# Patient Record
Sex: Male | Born: 1935 | Race: White | Hispanic: No | Smoking: Former smoker
Health system: Southern US, Community
[De-identification: ages and names within clinical notes are randomized; demographics above are authoritative.]

## PROBLEM LIST (undated history)

## (undated) DIAGNOSIS — K219 Gastro-esophageal reflux disease without esophagitis: Secondary | ICD-10-CM

## (undated) DIAGNOSIS — E785 Hyperlipidemia, unspecified: Secondary | ICD-10-CM

## (undated) DIAGNOSIS — I10 Essential (primary) hypertension: Secondary | ICD-10-CM

## (undated) HISTORY — DX: Hyperlipidemia, unspecified: E78.5

## (undated) HISTORY — DX: Essential (primary) hypertension: I10

## (undated) HISTORY — DX: Gastro-esophageal reflux disease without esophagitis: K21.9

---

## 2007-12-30 HISTORY — PX: HERNIA REPAIR: SHX51

## 2010-09-26 ENCOUNTER — Ambulatory Visit: Payer: Self-pay | Admitting: Family Medicine

## 2011-01-28 NOTE — Assessment & Plan Note (Signed)
Summary: FLU SHOT/JBB  Nurse Visit   Immunizations Administered:  Influenza Vaccine # 1:    Vaccine Type: flulaval    Site: left deltoid    Mfr: GlaxoSmithKline    Dose: 0.5 ml    Route: IM    Given by: Tacey Ruiz MD    Exp. Date: 04/29/2011    Lot #: [id number]    VIS given: 07/23/10 version given September 26, 2010.  Flu Vaccine Consent Questions:    Do you have a history of severe allergic reactions to this vaccine? no    Any prior history of allergic reactions to egg and/or gelatin? no    Do you have a sensitivity to the preservative Thimersol? no    Do you have a past history of Guillan-Barre Syndrome? no    Do you currently have an acute febrile illness? no    Have you ever had a severe reaction to latex? no    Vaccine information given and explained to patient? yes  Orders Added: 1)  Flulaval Injection 3 yrs >IM [Q2036] 2)  Administration Flu vaccine - MCR [G0008]

## 2014-01-02 ENCOUNTER — Ambulatory Visit: Payer: Self-pay | Admitting: Family Medicine

## 2014-03-14 ENCOUNTER — Ambulatory Visit: Payer: Self-pay | Admitting: Family Medicine

## 2015-01-05 DIAGNOSIS — Z23 Encounter for immunization: Secondary | ICD-10-CM | POA: Diagnosis not present

## 2015-01-05 DIAGNOSIS — I10 Essential (primary) hypertension: Secondary | ICD-10-CM | POA: Diagnosis not present

## 2015-01-05 DIAGNOSIS — K219 Gastro-esophageal reflux disease without esophagitis: Secondary | ICD-10-CM | POA: Diagnosis not present

## 2015-01-05 DIAGNOSIS — E780 Pure hypercholesterolemia: Secondary | ICD-10-CM | POA: Diagnosis not present

## 2015-01-05 DIAGNOSIS — Z0000 Encounter for general adult medical examination without abnormal findings: Secondary | ICD-10-CM | POA: Diagnosis not present

## 2015-01-08 DIAGNOSIS — R7309 Other abnormal glucose: Secondary | ICD-10-CM | POA: Diagnosis not present

## 2015-01-08 DIAGNOSIS — E780 Pure hypercholesterolemia: Secondary | ICD-10-CM | POA: Diagnosis not present

## 2015-01-08 DIAGNOSIS — K219 Gastro-esophageal reflux disease without esophagitis: Secondary | ICD-10-CM | POA: Diagnosis not present

## 2015-01-08 DIAGNOSIS — I10 Essential (primary) hypertension: Secondary | ICD-10-CM | POA: Diagnosis not present

## 2015-01-16 DIAGNOSIS — Z1389 Encounter for screening for other disorder: Secondary | ICD-10-CM | POA: Diagnosis not present

## 2015-01-16 DIAGNOSIS — R7309 Other abnormal glucose: Secondary | ICD-10-CM | POA: Diagnosis not present

## 2015-01-16 DIAGNOSIS — E780 Pure hypercholesterolemia: Secondary | ICD-10-CM | POA: Diagnosis not present

## 2015-01-16 DIAGNOSIS — J029 Acute pharyngitis, unspecified: Secondary | ICD-10-CM | POA: Diagnosis not present

## 2015-01-16 DIAGNOSIS — J069 Acute upper respiratory infection, unspecified: Secondary | ICD-10-CM | POA: Diagnosis not present

## 2015-01-18 DIAGNOSIS — L570 Actinic keratosis: Secondary | ICD-10-CM | POA: Diagnosis not present

## 2015-01-18 DIAGNOSIS — L821 Other seborrheic keratosis: Secondary | ICD-10-CM | POA: Diagnosis not present

## 2015-02-05 DIAGNOSIS — H3532 Exudative age-related macular degeneration: Secondary | ICD-10-CM | POA: Diagnosis not present

## 2015-02-28 DIAGNOSIS — Z9181 History of falling: Secondary | ICD-10-CM | POA: Diagnosis not present

## 2015-02-28 DIAGNOSIS — J069 Acute upper respiratory infection, unspecified: Secondary | ICD-10-CM | POA: Diagnosis not present

## 2015-02-28 DIAGNOSIS — J029 Acute pharyngitis, unspecified: Secondary | ICD-10-CM | POA: Diagnosis not present

## 2015-03-08 DIAGNOSIS — J0110 Acute frontal sinusitis, unspecified: Secondary | ICD-10-CM | POA: Diagnosis not present

## 2015-03-28 DIAGNOSIS — H3532 Exudative age-related macular degeneration: Secondary | ICD-10-CM | POA: Diagnosis not present

## 2015-05-16 DIAGNOSIS — H3532 Exudative age-related macular degeneration: Secondary | ICD-10-CM | POA: Diagnosis not present

## 2015-07-11 DIAGNOSIS — H3532 Exudative age-related macular degeneration: Secondary | ICD-10-CM | POA: Diagnosis not present

## 2015-07-17 ENCOUNTER — Encounter: Payer: Self-pay | Admitting: Family Medicine

## 2015-07-17 ENCOUNTER — Ambulatory Visit (INDEPENDENT_AMBULATORY_CARE_PROVIDER_SITE_OTHER): Payer: Medicare Other | Admitting: Family Medicine

## 2015-07-17 VITALS — BP 105/50 | HR 57 | Temp 97.5°F | Resp 16 | Ht 67.0 in | Wt 145.8 lb

## 2015-07-17 DIAGNOSIS — I10 Essential (primary) hypertension: Secondary | ICD-10-CM | POA: Insufficient documentation

## 2015-07-17 DIAGNOSIS — E785 Hyperlipidemia, unspecified: Secondary | ICD-10-CM | POA: Diagnosis not present

## 2015-07-17 DIAGNOSIS — K219 Gastro-esophageal reflux disease without esophagitis: Secondary | ICD-10-CM | POA: Insufficient documentation

## 2015-07-17 DIAGNOSIS — K2100 Gastro-esophageal reflux disease with esophagitis, without bleeding: Secondary | ICD-10-CM

## 2015-07-17 DIAGNOSIS — R7309 Other abnormal glucose: Secondary | ICD-10-CM

## 2015-07-17 DIAGNOSIS — R739 Hyperglycemia, unspecified: Secondary | ICD-10-CM

## 2015-07-17 LAB — POCT GLYCOSYLATED HEMOGLOBIN (HGB A1C): Hemoglobin A1C: 6.4

## 2015-07-17 MED ORDER — LISINOPRIL 10 MG PO TABS: 10.0000 mg | ORAL_TABLET | Freq: Every day | ORAL | Status: DC

## 2015-07-17 MED ORDER — PRAVASTATIN SODIUM 20 MG PO TABS: ORAL_TABLET | ORAL | Status: DC

## 2015-07-17 NOTE — Patient Instructions (Addendum)
Take 1/2 of 30 mg. Lisinopril until bottle empty, then change to 10 mg., 1 each day. Take 1/2 of 40 mg Pravastatin every other day until bottle empty, then change to 20 mg, every other night. Decrease starches and sugars in diet. Plan A1c on return.

## 2015-07-17 NOTE — Progress Notes (Signed)
Name: Travis Mccoy   MRN: [id number]    DOB: 1922/07/29   Date:07/17/2015       Progress Note  Subjective  Chief Complaint  Chief Complaint  Patient presents with  . Hypertension  . Arm Injury    3 WKS ago....skin damage at injury site left arm     HPI  Here for f/u of HBP.  Taking meds and feeling well.  Also with elevated lipids and GERD.  Doing well with these also.  Had been founmd to have FBS of 133 in past.  Not known to be diabetic.   Past Medical History  Diagnosis Date  . Hypertension   . Hyperlipidemia   . GERD (gastroesophageal reflux disease)     Past Surgical History  Procedure Laterality Date  . Hernia repair  2009    Family History  Problem Relation Age of Onset  . Stroke Father     History   Social History  . Marital Status: Married    Spouse Name: N/A  . Number of Children: N/A  . Years of Education: N/A   Occupational History  . Not on file.   Social History Main Topics  . Smoking status: Former Smoker    Types: Cigarettes    Quit date: 08/30/1959  . Smokeless tobacco: Never Used  . Alcohol Use: No  . Drug Use: No  . Sexual Activity: Not on file   Other Topics Concern  . Not on file   Social History Narrative  . No narrative on file     Current outpatient prescriptions:  .  aspirin 81 MG tablet, Take 81 mg by mouth daily., Disp: , Rfl:  .  fluticasone (FLONASE) 50 MCG/ACT nasal spray, , Disp: , Rfl:  .  lisinopril (PRINIVIL,ZESTRIL) 30 MG tablet, , Disp: , Rfl:  .  omeprazole (PRILOSEC) 20 MG capsule, , Disp: , Rfl:  .  pravastatin (PRAVACHOL) 40 MG tablet, , Disp: , Rfl:   No Known Allergies   Review of Systems  Constitutional: Negative.  Negative for fever, chills, weight loss and malaise/fatigue.  HENT: Negative.  Negative for congestion.   Eyes: Negative.  Negative for blurred vision and double vision.  Respiratory: Negative.  Negative for cough, sputum production, shortness of breath and wheezing.   Cardiovascular:  Negative.  Negative for chest pain, palpitations, orthopnea, claudication and leg swelling.  Gastrointestinal: Negative.  Negative for heartburn, nausea, vomiting, abdominal pain, diarrhea and blood in stool.  Genitourinary: Negative.  Negative for dysuria, urgency and frequency.  Musculoskeletal: Negative.  Negative for myalgias and joint pain.  Skin: Negative for rash.  Neurological: Negative for dizziness, tremors, sensory change, focal weakness, weakness and headaches.  Psychiatric/Behavioral: Negative for depression. The patient is not nervous/anxious.      Objective  Filed Vitals:   07/17/15 0856  BP: 109/58  Pulse: 57  Temp: 97.5 F (36.4 C)  Resp: 16  Height: 5\' 7"  (1.702 m)  Weight: 145 lb 12.8 oz (66.134 kg)    Physical Exam  Constitutional: He is oriented to person, place, and time and well-developed, well-nourished, and in no distress.  HENT:  Head: Normocephalic and atraumatic.  Eyes: Conjunctivae and EOM are normal. Pupils are equal, round, and reactive to light.  Neck: Normal range of motion. Neck supple. No thyromegaly present.  Cardiovascular: Normal rate, regular rhythm, normal heart sounds and intact distal pulses.  Exam reveals no gallop and no friction rub.   No murmur heard. Pulmonary/Chest: Effort normal and breath  sounds normal. No respiratory distress. He has no wheezes. He has no rales.  Abdominal: Soft. Bowel sounds are normal. He exhibits no distension and no mass. There is no tenderness.  Musculoskeletal: He exhibits no edema.  Lymphadenopathy:    He has no cervical adenopathy.  Neurological: He is alert and oriented to person, place, and time. He exhibits normal muscle tone. Coordination normal.  Vitals reviewed.        Assessment & Plan  Problem List Items Addressed This Visit      Cardiovascular and Mediastinum   Hypertension - Primary   Relevant Medications   pravastatin (PRAVACHOL) 40 MG tablet   lisinopril (PRINIVIL,ZESTRIL) 30  MG tablet   aspirin 81 MG tablet     Digestive   GERD (gastroesophageal reflux disease)   Relevant Medications   omeprazole (PRILOSEC) 20 MG capsule     Other   Hyperlipidemia   Relevant Medications   pravastatin (PRAVACHOL) 40 MG tablet   lisinopril (PRINIVIL,ZESTRIL) 30 MG tablet   aspirin 81 MG tablet      Meds ordered this encounter  Medications  . pravastatin (PRAVACHOL) 40 MG tablet    Sig:   . omeprazole (PRILOSEC) 20 MG capsule    Sig:   . lisinopril (PRINIVIL,ZESTRIL) 30 MG tablet    Sig:   . fluticasone (FLONASE) 50 MCG/ACT nasal spray    Sig:   . aspirin 81 MG tablet    Sig: Take 81 mg by mouth daily.   1. Essential hypertension  - lisinopril (PRINIVIL,ZESTRIL) 10 MG tablet; Take 1 tablet (10 mg total) by mouth daily.  Dispense: 90 tablet; Refill: 3-decreased  2. Hyperlipidemia  - pravastatin (PRAVACHOL) 20 MG tablet; Take 1 tablet by mouth every other evening  Dispense: 45 tablet; Refill: 3-decreased  3. Gastroesophageal reflux disease with esophagitis --continue Omeprazole  4. Elevated blood sugar  - POCT HgB A1C-6.4

## 2015-09-21 DIAGNOSIS — H3532 Exudative age-related macular degeneration: Secondary | ICD-10-CM | POA: Diagnosis not present

## 2015-10-03 ENCOUNTER — Encounter: Payer: Self-pay | Admitting: Family Medicine

## 2015-10-03 ENCOUNTER — Ambulatory Visit (INDEPENDENT_AMBULATORY_CARE_PROVIDER_SITE_OTHER): Payer: Medicare Other | Admitting: Family Medicine

## 2015-10-03 VITALS — BP 129/79 | HR 78 | Temp 97.9°F | Resp 16 | Ht 67.0 in | Wt 148.2 lb

## 2015-10-03 DIAGNOSIS — E785 Hyperlipidemia, unspecified: Secondary | ICD-10-CM | POA: Diagnosis not present

## 2015-10-03 DIAGNOSIS — J302 Other seasonal allergic rhinitis: Secondary | ICD-10-CM

## 2015-10-03 DIAGNOSIS — Z0189 Encounter for other specified special examinations: Secondary | ICD-10-CM

## 2015-10-03 DIAGNOSIS — I10 Essential (primary) hypertension: Secondary | ICD-10-CM

## 2015-10-03 DIAGNOSIS — Z23 Encounter for immunization: Secondary | ICD-10-CM | POA: Diagnosis not present

## 2015-10-03 DIAGNOSIS — K2100 Gastro-esophageal reflux disease with esophagitis, without bleeding: Secondary | ICD-10-CM

## 2015-10-03 DIAGNOSIS — Z0000 Encounter for general adult medical examination without abnormal findings: Secondary | ICD-10-CM

## 2015-10-03 MED ORDER — FLUTICASONE PROPIONATE 50 MCG/ACT NA SUSP: 2.0000 | Freq: Every day | NASAL | Status: DC

## 2015-10-03 MED ORDER — OMEPRAZOLE 20 MG PO CPDR: 20.0000 mg | DELAYED_RELEASE_CAPSULE | Freq: Two times a day (BID) | ORAL | Status: DC

## 2015-10-03 MED ORDER — LISINOPRIL 10 MG PO TABS: 10.0000 mg | ORAL_TABLET | Freq: Every day | ORAL | Status: DC

## 2015-10-03 MED ORDER — PRAVASTATIN SODIUM 20 MG PO TABS: ORAL_TABLET | ORAL | Status: DC

## 2015-10-03 NOTE — Progress Notes (Signed)
Name: Travis Mccoy   MRN: [id number]    DOB: 1922/10/14   Date:10/03/2015       Progress Note  Subjective  Chief Complaint  Chief Complaint  Patient presents with  . Annual Exam    HPI  Here for annual wellness examination.  Has HBP, Hyperlipidemia, GERD, Seasonal allergies.  C/o some insomnia problems on and off.  GERD not totally controlled on 1 Omeprazole in evening.  Otherwise no c/o No problem-specific assessment & plan notes found for this encounter.   Past Medical History  Diagnosis Date  . Hypertension   . Hyperlipidemia   . GERD (gastroesophageal reflux disease)     Past Surgical History  Procedure Laterality Date  . Hernia repair  2009    Family History  Problem Relation Age of Onset  . Stroke Father     Social History   Social History  . Marital Status: Married    Spouse Name: N/A  . Number of Children: N/A  . Years of Education: N/A   Occupational History  . Not on file.   Social History Main Topics  . Smoking status: Former Smoker    Types: Cigarettes    Quit date: 08/30/1959  . Smokeless tobacco: Never Used  . Alcohol Use: Yes     Comment: beers  . Drug Use: No  . Sexual Activity: Not on file   Other Topics Concern  . Not on file   Social History Narrative     Current outpatient prescriptions:  .  aspirin 81 MG tablet, Take 81 mg by mouth daily., Disp: , Rfl:  .  fluticasone (FLONASE) 50 MCG/ACT nasal spray, Place 2 sprays into both nostrils daily., Disp: 16 g, Rfl: 12 .  lisinopril (PRINIVIL,ZESTRIL) 10 MG tablet, Take 1 tablet (10 mg total) by mouth daily., Disp: 90 tablet, Rfl: 3 .  omeprazole (PRILOSEC) 20 MG capsule, Take 1 capsule (20 mg total) by mouth 2 (two) times daily before a meal., Disp: 60 capsule, Rfl: 12 .  pravastatin (PRAVACHOL) 20 MG tablet, Take 1 tablet by mouth every other evening, Disp: 45 tablet, Rfl: 3  No Known Allergies   Review of Systems  Constitutional: Negative for fever, chills, weight loss and  malaise/fatigue.  HENT: Negative for congestion and hearing loss.   Eyes: Negative for blurred vision and double vision.  Respiratory: Negative for cough, shortness of breath and wheezing.   Cardiovascular: Negative for chest pain, palpitations, orthopnea and leg swelling.  Gastrointestinal: Positive for heartburn. Negative for nausea, vomiting, abdominal pain, diarrhea and blood in stool.  Genitourinary: Negative for dysuria, urgency and frequency.  Musculoskeletal: Negative for myalgias and joint pain.  Skin: Negative for rash.  Neurological: Negative for dizziness, tremors, sensory change, focal weakness, weakness and headaches.  Psychiatric/Behavioral: The patient has insomnia (occ.).       Objective  Filed Vitals:   10/03/15 0911  BP: 129/79  Pulse: 78  Temp: 97.9 F (36.6 C)  TempSrc: Oral  Resp: 16  Height: 5\' 7"  (1.702 m)  Weight: 148 lb 3.2 oz (67.223 kg)    Physical Exam  Constitutional: He is oriented to person, place, and time and well-developed, well-nourished, and in no distress. No distress.  HENT:  Head: Normocephalic and atraumatic.  Right Ear: External ear normal.  Left Ear: External ear normal.  Nose: Nose normal.  Mouth/Throat: Oropharynx is clear and moist.  Eyes: Conjunctivae and EOM are normal. Pupils are equal, round, and reactive to light. No scleral icterus.  Fundoscopic exam:      The right eye shows no arteriolar narrowing, no AV nicking, no exudate and no hemorrhage.    Neck: Normal range of motion. Neck supple. Carotid bruit is not present. No thyromegaly present.  Cardiovascular: Normal rate, regular rhythm, normal heart sounds and intact distal pulses.  Exam reveals no gallop and no friction rub.   No murmur heard. Pulmonary/Chest: Effort normal and breath sounds normal. No respiratory distress. He has no wheezes. He has no rales.  Abdominal: Soft. Bowel sounds are normal. He exhibits no distension and no abdominal bruit. There is no  tenderness. There is no rebound.  Genitourinary: Penis normal. He exhibits no abnormal testicular mass, no testicular tenderness, no abnormal scrotal mass, no scrotal tenderness and no epididymal tenderness. No discharge found.  Musculoskeletal: Normal range of motion. He exhibits no edema or tenderness.  Lymphadenopathy:    He has no cervical adenopathy.  Neurological: He is alert and oriented to person, place, and time. No cranial nerve deficit. Gait normal.  Skin: Skin is warm and dry.  Vitals reviewed.      Recent Results (from the past 2160 hour(s))  POCT HgB A1C     Status: Abnormal   Collection Time: 07/17/15  9:27 AM  Result Value Ref Range   Hemoglobin A1C 6.4      Assessment & Plan  Problem List Items Addressed This Visit      Cardiovascular and Mediastinum   Hypertension   Relevant Medications   lisinopril (PRINIVIL,ZESTRIL) 10 MG tablet   pravastatin (PRAVACHOL) 20 MG tablet   Other Relevant Orders   Comprehensive Metabolic Panel (CMET)     Respiratory   Seasonal allergies   Relevant Medications   fluticasone (FLONASE) 50 MCG/ACT nasal spray     Digestive   GERD (gastroesophageal reflux disease)   Relevant Medications   omeprazole (PRILOSEC) 20 MG capsule   Other Relevant Orders   CBC with Differential     Other   Hyperlipidemia   Relevant Medications   lisinopril (PRINIVIL,ZESTRIL) 10 MG tablet   pravastatin (PRAVACHOL) 20 MG tablet   Other Relevant Orders   Lipid Profile   Wellness examination - Primary   Need for influenza vaccination   Relevant Orders   Flu vaccine HIGH DOSE PF (Fluzone High dose) (Completed)      Meds ordered this encounter  Medications  . omeprazole (PRILOSEC) 20 MG capsule    Sig: Take 1 capsule (20 mg total) by mouth 2 (two) times daily before a meal.    Dispense:  60 capsule    Refill:  12  . fluticasone (FLONASE) 50 MCG/ACT nasal spray    Sig: Place 2 sprays into both nostrils daily.    Dispense:  16 g     Refill:  12  . lisinopril (PRINIVIL,ZESTRIL) 10 MG tablet    Sig: Take 1 tablet (10 mg total) by mouth daily.    Dispense:  90 tablet    Refill:  3  . pravastatin (PRAVACHOL) 20 MG tablet    Sig: Take 1 tablet by mouth every other evening    Dispense:  45 tablet    Refill:  3    1. Wellness examination   2. Need for influenza vaccination  - Flu vaccine HIGH DOSE PF (Fluzone High dose)  3. Essential hypertension  - lisinopril (PRINIVIL,ZESTRIL) 10 MG tablet; Take 1 tablet (10 mg total) by mouth daily.  Dispense: 90 tablet; Refill: 3 - Comprehensive Metabolic Panel (CMET)  4.  Hyperlipidemia  - pravastatin (PRAVACHOL) 20 MG tablet; Take 1 tablet by mouth every other evening  Dispense: 45 tablet; Refill: 3 - Lipid Profile  5. Gastroesophageal reflux disease with esophagitis  - omeprazole (PRILOSEC) 20 MG capsule; Take 1 capsule (20 mg total) by mouth 2 (two) times daily before a meal.  Dispense: 60 capsule; Refill: 12 - CBC with Differential  6. Seasonal allergies  - fluticasone (FLONASE) 50 MCG/ACT nasal spray; Place 2 sprays into both nostrils daily.  Dispense: 16 g; Refill: 12

## 2015-10-03 NOTE — Patient Instructions (Signed)
Continue current meds 

## 2015-10-09 DIAGNOSIS — E785 Hyperlipidemia, unspecified: Secondary | ICD-10-CM | POA: Diagnosis not present

## 2015-10-09 DIAGNOSIS — R7309 Other abnormal glucose: Secondary | ICD-10-CM | POA: Diagnosis not present

## 2015-10-09 DIAGNOSIS — K210 Gastro-esophageal reflux disease with esophagitis: Secondary | ICD-10-CM | POA: Diagnosis not present

## 2015-10-09 DIAGNOSIS — I10 Essential (primary) hypertension: Secondary | ICD-10-CM | POA: Diagnosis not present

## 2015-10-09 LAB — CBC WITH DIFFERENTIAL/PLATELET
BASOS: 1 %
Basophils Absolute: 0.1 10*3/uL (ref 0.0–0.2)
EOS (ABSOLUTE): 0.2 10*3/uL (ref 0.0–0.4)
Eos: 3 %
Hematocrit: 42.3 % (ref 37.5–51.0)
Hemoglobin: 14.6 g/dL (ref 12.6–17.7)
IMMATURE GRANS (ABS): 0 10*3/uL (ref 0.0–0.1)
Immature Granulocytes: 0 %
LYMPHS ABS: 2.2 10*3/uL (ref 0.7–3.1)
LYMPHS: 30 %
MCH: 31.9 pg (ref 26.6–33.0)
MCHC: 34.5 g/dL (ref 31.5–35.7)
MCV: 92 fL (ref 79–97)
MONOS ABS: 0.7 10*3/uL (ref 0.1–0.9)
Monocytes: 9 %
NEUTROS ABS: 4.2 10*3/uL (ref 1.4–7.0)
Neutrophils: 57 %
PLATELETS: 198 10*3/uL (ref 150–379)
RBC: 4.58 x10E6/uL (ref 4.14–5.80)
RDW: 13.3 % (ref 12.3–15.4)
WBC: 7.3 10*3/uL (ref 3.4–10.8)

## 2015-10-10 LAB — LIPID PANEL
Chol/HDL Ratio: 3.6 ratio units (ref 0.0–5.0)
Cholesterol, Total: 176 mg/dL (ref 100–199)
HDL: 49 mg/dL (ref >39)
LDL Calculated: 99 mg/dL (ref 0–99)
TRIGLYCERIDES: 141 mg/dL (ref 0–149)
VLDL CHOLESTEROL CAL: 28 mg/dL (ref 5–40)

## 2015-10-10 LAB — COMPREHENSIVE METABOLIC PANEL
ALT: 19 IU/L (ref 0–44)
AST: 25 IU/L (ref 0–40)
Albumin/Globulin Ratio: 2.2 (ref 1.1–2.5)
Albumin: 4.3 g/dL (ref 3.2–4.6)
Alkaline Phosphatase: 103 IU/L (ref 39–117)
BUN/Creatinine Ratio: 17 (ref 10–22)
BUN: 15 mg/dL (ref 10–36)
Bilirubin Total: 0.4 mg/dL (ref 0.0–1.2)
CALCIUM: 9.5 mg/dL (ref 8.6–10.2)
CHLORIDE: 100 mmol/L (ref 97–108)
CO2: 27 mmol/L (ref 18–29)
Creatinine, Ser: 0.88 mg/dL (ref 0.76–1.27)
GFR, EST AFRICAN AMERICAN: 87 mL/min/{1.73_m2} (ref >59)
GFR, EST NON AFRICAN AMERICAN: 75 mL/min/{1.73_m2} (ref >59)
GLUCOSE: 126 mg/dL — AB (ref 65–99)
Globulin, Total: 2 g/dL (ref 1.5–4.5)
Potassium: 4.7 mmol/L (ref 3.5–5.2)
Sodium: 140 mmol/L (ref 134–144)
TOTAL PROTEIN: 6.3 g/dL (ref 6.0–8.5)

## 2015-10-12 LAB — HGB A1C W/O EAG: HEMOGLOBIN A1C: 6.2 % — AB (ref 4.8–5.6)

## 2015-10-12 LAB — SPECIMEN STATUS REPORT

## 2015-10-24 ENCOUNTER — Ambulatory Visit: Payer: Medicare Other | Admitting: Family Medicine

## 2015-12-21 DIAGNOSIS — H353211 Exudative age-related macular degeneration, right eye, with active choroidal neovascularization: Secondary | ICD-10-CM | POA: Diagnosis not present

## 2016-01-29 ENCOUNTER — Encounter: Payer: Self-pay | Admitting: Family Medicine

## 2016-01-29 ENCOUNTER — Ambulatory Visit (INDEPENDENT_AMBULATORY_CARE_PROVIDER_SITE_OTHER): Payer: Medicare Other | Admitting: Family Medicine

## 2016-01-29 VITALS — BP 157/75 | HR 55 | Temp 97.4°F | Resp 16 | Ht 67.0 in | Wt 148.2 lb

## 2016-01-29 DIAGNOSIS — Z0000 Encounter for general adult medical examination without abnormal findings: Secondary | ICD-10-CM | POA: Diagnosis not present

## 2016-01-29 NOTE — Patient Instructions (Signed)
Health Maintenance  Topic Date Due  . ZOSTAVAX  12/29/2016 (Originally 09/22/1984)  . INFLUENZA VACCINE  07/29/2016  . TETANUS/TDAP  01/05/2025  . PNA vac Low Risk Adult  Completed

## 2016-01-29 NOTE — Progress Notes (Signed)
Patient: Travis Mccoy, Male    DOB: 1922/12/01, 82 y.o.   MRN: :[id number] Visit Date: 01/29/2016  Today's Provider: Dicky Doe, MD   Chief Complaint  Patient presents with  . Medicare Wellness    Subs.    Subjective:    Annual wellness visit Travis Mccoy is a 82 y.o. male who presents today for his Subsequent Annual Wellness Visit. He feels well. He reports exercising . He reports he is sleeping well.   ----------------------------------------------------------- HPI  Review of Systems  Social History   Social History  . Marital Status: Married    Spouse Name: N/A  . Number of Children: N/A  . Years of Education: N/A   Occupational History  . Not on file.   Social History Main Topics  . Smoking status: Former Smoker    Types: Cigarettes    Quit date: 08/30/1959  . Smokeless tobacco: Never Used  . Alcohol Use: Yes     Comment: beers  . Drug Use: No  . Sexual Activity: Not on file   Other Topics Concern  . Not on file   Social History Narrative    Patient Active Problem List   Diagnosis Date Noted  . Wellness examination 10/03/2015  . Seasonal allergies 10/03/2015  . Need for influenza vaccination 10/03/2015  . Hypertension 07/17/2015  . Hyperlipidemia 07/17/2015  . GERD (gastroesophageal reflux disease) 07/17/2015  . Elevated blood sugar 07/17/2015    Past Surgical History  Procedure Laterality Date  . Hernia repair  2009    His family history includes Stroke in his father.    Previous Medications   ASPIRIN 81 MG TABLET    Take 81 mg by mouth daily.   FLUTICASONE (FLONASE) 50 MCG/ACT NASAL SPRAY    Place 2 sprays into both nostrils daily.   LISINOPRIL (PRINIVIL,ZESTRIL) 10 MG TABLET    Take 1 tablet (10 mg total) by mouth daily.   OMEPRAZOLE (PRILOSEC) 20 MG CAPSULE    Take 1 capsule (20 mg total) by mouth 2 (two) times daily before a meal.   PRAVASTATIN (PRAVACHOL) 20 MG TABLET    Take 1 tablet by mouth every other evening    Patient Care  Team: Arlis Porta., MD as PCP - General (Family Medicine)     Objective:   Vitals: BP 157/75 mmHg  Pulse 55  Temp(Src) 97.4 F (36.3 C)  Resp 16  Ht 5\' 7"  (1.702 m)  Wt 148 lb 3.2 oz (67.223 kg)  BMI 23.21 kg/m2  Physical Exam  Activities of Daily Living In your present state of health, do you have any difficulty performing the following activities: 01/29/2016 07/17/2015  Hearing? Tempie Donning  Vision? N Y  Difficulty concentrating or making decisions? Tempie Donning  Walking or climbing stairs? Y Y  Dressing or bathing? N N  Doing errands, shopping? N N  Preparing Food and eating ? N -  Using the Toilet? N -  In the past six months, have you accidently leaked urine? N -  Do you have problems with loss of bowel control? N -  Managing your Medications? N -  Managing your Finances? N -  Housekeeping or managing your Housekeeping? N -    Fall Risk Assessment Fall Risk  01/29/2016 07/17/2015  Falls in the past year? Yes No  Number falls in past yr: 1 -  Injury with Fall? No -  Risk for fall due to : Other (Comment) -     Patient reports  there are safety devices in place in shower at home.   Depression Screen PHQ 2/9 Scores 01/29/2016 07/17/2015  PHQ - 2 Score 0 0     MMSE MMSE - Mini Mental State Exam 01/29/2016  Orientation to time 5  Orientation to Place 5  Registration 3  Attention/ Calculation 5  Recall 0  Language- name 2 objects 2  Language- repeat 1  Language- follow 3 step command 3  Language- read & follow direction 1  Write a sentence 1  Copy design 1  Total score 27     Assessment & Plan:     Annual Wellness Visit  Reviewed patient's Family Medical History Reviewed and updated list of patient's medical providers Assessment of cognitive impairment was done Assessed patient's functional ability Established a written schedule for health screening [place] Completed and Reviewed  Exercise Activities and Dietary recommendations Goals     . cleaning     Would like to clean rifles and put away for winter.        Immunization History  Administered Date(s) Administered  . Influenza Whole 09/26/2010  . Influenza, High Dose Seasonal PF 10/03/2015  . Influenza-Unspecified 10/10/2014  . Pneumococcal Conjugate-13 09/27/2014  . Pneumococcal Polysaccharide-23 12/30/2007  . Tdap 01/05/2015    Health Maintenance  Topic Date Due  . ZOSTAVAX  12/29/2016 (Originally 09/22/1984)  . INFLUENZA VACCINE  07/29/2016  . TETANUS/TDAP  01/05/2025  . PNA vac Low Risk Adult  Completed     Discussed health benefits of physical activity, and encouraged him to engage in regular exercise appropriate for his age and condition.    ------------------------------------------------------------------------------------------------------------   Problem List Items Addressed This Visit    None       Larene Beach, MD [facility]  01/29/2016

## 2016-02-29 DIAGNOSIS — H353211 Exudative age-related macular degeneration, right eye, with active choroidal neovascularization: Secondary | ICD-10-CM | POA: Diagnosis not present

## 2016-04-03 ENCOUNTER — Ambulatory Visit (INDEPENDENT_AMBULATORY_CARE_PROVIDER_SITE_OTHER): Payer: Medicare Other | Admitting: Family Medicine

## 2016-04-03 ENCOUNTER — Encounter: Payer: Self-pay | Admitting: Family Medicine

## 2016-04-03 VITALS — BP 160/80 | HR 70 | Temp 98.1°F | Resp 16 | Ht 67.0 in | Wt 152.0 lb

## 2016-04-03 DIAGNOSIS — R7309 Other abnormal glucose: Secondary | ICD-10-CM

## 2016-04-03 DIAGNOSIS — R739 Hyperglycemia, unspecified: Secondary | ICD-10-CM

## 2016-04-03 DIAGNOSIS — I10 Essential (primary) hypertension: Secondary | ICD-10-CM | POA: Diagnosis not present

## 2016-04-03 MED ORDER — LISINOPRIL 20 MG PO TABS: 20.0000 mg | ORAL_TABLET | Freq: Every day | ORAL | Status: DC

## 2016-04-03 NOTE — Progress Notes (Signed)
Name: Travis Mccoy   MRN: [id number]    DOB: 11-25-1922   Date:04/03/2016       Progress Note  Subjective  Chief Complaint  Chief Complaint  Patient presents with  . Hypertension    HPI Here for f/u of HB P.  Taking meds and feeling well.  He is prediabetic with last A1c of 6.2.  No problem-specific assessment & plan notes found for this encounter.   Past Medical History  Diagnosis Date  . Hypertension   . Hyperlipidemia   . GERD (gastroesophageal reflux disease)     Past Surgical History  Procedure Laterality Date  . Hernia repair  2009    Family History  Problem Relation Age of Onset  . Stroke Father     Social History   Social History  . Marital Status: Married    Spouse Name: N/A  . Number of Children: N/A  . Years of Education: N/A   Occupational History  . Not on file.   Social History Main Topics  . Smoking status: Former Smoker    Types: Cigarettes    Quit date: 08/30/1959  . Smokeless tobacco: Never Used  . Alcohol Use: Yes     Comment: beers  . Drug Use: No  . Sexual Activity: Not on file   Other Topics Concern  . Not on file   Social History Narrative     Current outpatient prescriptions:  .  aspirin 81 MG tablet, Take 81 mg by mouth daily., Disp: , Rfl:  .  fluticasone (FLONASE) 50 MCG/ACT nasal spray, Place 2 sprays into both nostrils daily., Disp: 16 g, Rfl: 12 .  lisinopril (PRINIVIL,ZESTRIL) 20 MG tablet, Take 1 tablet (20 mg total) by mouth daily., Disp: 30 tablet, Rfl: 6 .  omeprazole (PRILOSEC) 20 MG capsule, Take 1 capsule (20 mg total) by mouth 2 (two) times daily before a meal., Disp: 60 capsule, Rfl: 12 .  pravastatin (PRAVACHOL) 20 MG tablet, Take 1 tablet by mouth every other evening, Disp: 45 tablet, Rfl: 3  No Known Allergies   Review of Systems  Constitutional: Positive for malaise/fatigue. Negative for fever, chills and weight loss.  HENT: Negative for hearing loss.   Eyes: Negative for blurred vision and double  vision.  Respiratory: Negative for cough, shortness of breath and wheezing.   Cardiovascular: Negative for chest pain, palpitations and leg swelling.  Gastrointestinal: Negative for heartburn, abdominal pain and blood in stool.  Genitourinary: Negative for dysuria, urgency and frequency.  Musculoskeletal: Negative for myalgias.  Skin: Negative for rash.  Neurological: Negative for dizziness, tremors, weakness and headaches.      Objective  Filed Vitals:   04/03/16 0932 04/03/16 0956  BP: 169/79 160/80  Pulse: 70   Temp: 98.1 F (36.7 C)   TempSrc: Oral   Resp: 16   Height: 5\' 7"  (1.702 m)   Weight: 152 lb (68.947 kg)   SpO2: 100%     Physical Exam  Constitutional: He is oriented to person, place, and time and well-developed, well-nourished, and in no distress. No distress.  HENT:  Head: Normocephalic and atraumatic.  Eyes: Conjunctivae and EOM are normal. Pupils are equal, round, and reactive to light. No scleral icterus.  Neck: Normal range of motion. Neck supple. Carotid bruit is not present. No thyromegaly present.  Cardiovascular: Normal rate, regular rhythm and normal heart sounds.  Exam reveals no gallop and no friction rub.   No murmur heard. Pulmonary/Chest: Effort normal and breath sounds normal. No  respiratory distress. He has no wheezes. He has no rales.  Musculoskeletal: He exhibits no edema.  Lymphadenopathy:    He has no cervical adenopathy.  Neurological: He is alert and oriented to person, place, and time.  Vitals reviewed.      No results found for this or any previous visit (from the past 2160 hour(s)).   Assessment & Plan  Problem List Items Addressed This Visit      Cardiovascular and Mediastinum   Hypertension - Primary   Relevant Medications   lisinopril (PRINIVIL,ZESTRIL) 20 MG tablet   Other Relevant Orders   BASIC METABOLIC PANEL WITH GFR     Other   Elevated blood sugar   Relevant Orders   HgB A1c      Meds ordered this  encounter  Medications  . lisinopril (PRINIVIL,ZESTRIL) 20 MG tablet    Sig: Take 1 tablet (20 mg total) by mouth daily.    Dispense:  30 tablet    Refill:  6   1. Essential hypertension  - BASIC METABOLIC PANEL WITH GFR - lisinopril (PRINIVIL,ZESTRIL) 20 MG tablet; Take 1 tablet (20 mg total) by mouth daily.  Dispense: 30 tablet; Refill: 6 (increased from 10 mg/d.)  2. Elevated blood sugar  - HgB A1c

## 2016-04-07 DIAGNOSIS — H353211 Exudative age-related macular degeneration, right eye, with active choroidal neovascularization: Secondary | ICD-10-CM | POA: Diagnosis not present

## 2016-04-10 ENCOUNTER — Ambulatory Visit (INDEPENDENT_AMBULATORY_CARE_PROVIDER_SITE_OTHER): Payer: Medicare Other | Admitting: Family Medicine

## 2016-04-10 VITALS — BP 115/65 | HR 76 | Temp 97.7°F | Resp 16 | Ht 67.0 in | Wt 152.8 lb

## 2016-04-10 DIAGNOSIS — R05 Cough: Secondary | ICD-10-CM | POA: Diagnosis not present

## 2016-04-10 DIAGNOSIS — J0100 Acute maxillary sinusitis, unspecified: Secondary | ICD-10-CM

## 2016-04-10 DIAGNOSIS — R059 Cough, unspecified: Secondary | ICD-10-CM

## 2016-04-10 MED ORDER — DM-GUAIFENESIN ER 30-600 MG PO TB12: 1.0000 | ORAL_TABLET | Freq: Two times a day (BID) | ORAL | Status: DC

## 2016-04-10 MED ORDER — AMOXICILLIN-POT CLAVULANATE 875-125 MG PO TABS: 1.0000 | ORAL_TABLET | Freq: Two times a day (BID) | ORAL | Status: DC

## 2016-04-10 MED ORDER — BENZONATATE 100 MG PO CAPS: 100.0000 mg | ORAL_CAPSULE | Freq: Every evening | ORAL | Status: DC | PRN

## 2016-04-10 NOTE — Patient Instructions (Signed)
You can use supportive care at home to help with your symptoms. I have sent Mucinex DM to your pharmacy to help break up the congestion and soothe your cough. You can takes this twice daily.  I have also sent tesslon perles to your pharmacy to help with the cough- you can take these 3 times daily as needed. Honey is a natural cough suppressant- so add it to your tea in the morning.  If you have a humidifer, set that up in your bedroom at night.   Take Augment twice daily for 5 days.   Please seek immediate medical attention if you develop shortness of breath , chest pain/tightness, fever > 103 F or other concerning symptoms.

## 2016-04-10 NOTE — Progress Notes (Signed)
Subjective:    Patient ID: Travis Mccoy, male    DOB: 1922-11-03, 82 y.o.   MRN: [id number]  HPI: Travis Mccoy is a 82 y.o. male presenting on 04/10/2016 for Influenza   HPI  Was at hospital to get a shot in his eye for macular degeneration and started having symptoms shortly after leaving hospital. He is worried that he picked up something there. Symptoms include rhinorrhea- green drainage, sneezing, coughing. Not a productive cough. No CP/SOB, no fever. Symptoms started last Tuesday. He started taking mucinex yesterday morning, which has helped his symptoms. No change to appetite. Coughing is waking him up at night.  Past Medical History  Diagnosis Date  . Hypertension   . Hyperlipidemia   . GERD (gastroesophageal reflux disease)     Current Outpatient Prescriptions on File Prior to Visit  Medication Sig  . aspirin 81 MG tablet Take 81 mg by mouth daily.  . fluticasone (FLONASE) 50 MCG/ACT nasal spray Place 2 sprays into both nostrils daily.  Marland Kitchen lisinopril (PRINIVIL,ZESTRIL) 20 MG tablet Take 1 tablet (20 mg total) by mouth daily.  Marland Kitchen omeprazole (PRILOSEC) 20 MG capsule Take 1 capsule (20 mg total) by mouth 2 (two) times daily before a meal.  . pravastatin (PRAVACHOL) 20 MG tablet Take 1 tablet by mouth every other evening   No current facility-administered medications on file prior to visit.    Review of Systems  Constitutional: Negative for fever and appetite change.  HENT: Positive for congestion, hearing loss (chronic), rhinorrhea and sneezing. Negative for ear pain.   Eyes: Negative for visual disturbance.  Respiratory: Positive for cough. Negative for chest tightness and shortness of breath.   Cardiovascular: Negative for chest pain.  Gastrointestinal: Negative for abdominal pain.  Genitourinary: Negative for difficulty urinating.  Musculoskeletal: Negative for myalgias and arthralgias.  Skin: Negative for color change.  Neurological: Negative for headaches.    Psychiatric/Behavioral: Positive for sleep disturbance.   Per HPI unless specifically indicated above     Objective:    BP 115/65 mmHg  Pulse 76  Temp(Src) 97.7 F (36.5 C) (Oral)  Resp 16  Ht 5\' 7"  (1.702 m)  Wt 152 lb 12.8 oz (69.31 kg)  BMI 23.93 kg/m2  SpO2 97%  Wt Readings from Last 3 Encounters:  04/10/16 152 lb 12.8 oz (69.31 kg)  04/03/16 152 lb (68.947 kg)  01/29/16 148 lb 3.2 oz (67.223 kg)    Physical Exam  Constitutional: He is oriented to person, place, and time. He appears well-developed and well-nourished.  HENT:  Head: Normocephalic.  Right Ear: Decreased hearing is noted.  Left Ear: Decreased hearing is noted.  Nose: Mucosal edema and rhinorrhea present. No nose lacerations. Right sinus exhibits no maxillary sinus tenderness and no frontal sinus tenderness. Left sinus exhibits no maxillary sinus tenderness and no frontal sinus tenderness.  Mouth/Throat: Oropharynx is clear and moist and mucous membranes are normal. No oropharyngeal exudate, posterior oropharyngeal edema or posterior oropharyngeal erythema.  Cerumen present in bilateral ear canals  Neck: Normal range of motion.  Cardiovascular: Normal rate, regular rhythm and normal heart sounds.   Pulmonary/Chest: Effort normal and breath sounds normal. No respiratory distress.  Musculoskeletal: Normal range of motion.  Lymphadenopathy:    He has no cervical adenopathy.  Neurological: He is alert and oriented to person, place, and time.  Skin: Skin is warm and dry.  Psychiatric: He has a normal mood and affect. His behavior is normal.   Results for orders placed or  performed in visit on 10/03/15  Comprehensive Metabolic Panel (CMET)  Result Value Ref Range   Glucose 126 (H) 65 - 99 mg/dL   BUN 15 10 - 36 mg/dL   Creatinine, Ser 0.88 0.76 - 1.27 mg/dL   GFR calc non Af Amer 75 >59 mL/min/1.73   GFR calc Af Amer 87 >59 mL/min/1.73   BUN/Creatinine Ratio 17 10 - 22   Sodium 140 134 - 144 mmol/L    Potassium 4.7 3.5 - 5.2 mmol/L   Chloride 100 97 - 108 mmol/L   CO2 27 18 - 29 mmol/L   Calcium 9.5 8.6 - 10.2 mg/dL   Total Protein 6.3 6.0 - 8.5 g/dL   Albumin 4.3 3.2 - 4.6 g/dL   Globulin, Total 2.0 1.5 - 4.5 g/dL   Albumin/Globulin Ratio 2.2 1.1 - 2.5   Bilirubin Total 0.4 0.0 - 1.2 mg/dL   Alkaline Phosphatase 103 39 - 117 IU/L   AST 25 0 - 40 IU/L   ALT 19 0 - 44 IU/L  CBC with Differential  Result Value Ref Range   WBC 7.3 3.4 - 10.8 x10E3/uL   RBC 4.58 4.14 - 5.80 x10E6/uL   Hemoglobin 14.6 12.6 - 17.7 g/dL   Hematocrit 42.3 37.5 - 51.0 %   MCV 92 79 - 97 fL   MCH 31.9 26.6 - 33.0 pg   MCHC 34.5 31.5 - 35.7 g/dL   RDW 13.3 12.3 - 15.4 %   Platelets 198 150 - 379 x10E3/uL   Neutrophils 57 %   Lymphs 30 %   Monocytes 9 %   Eos 3 %   Basos 1 %   Neutrophils Absolute 4.2 1.4 - 7.0 x10E3/uL   Lymphocytes Absolute 2.2 0.7 - 3.1 x10E3/uL   Monocytes Absolute 0.7 0.1 - 0.9 x10E3/uL   EOS (ABSOLUTE) 0.2 0.0 - 0.4 x10E3/uL   Basophils Absolute 0.1 0.0 - 0.2 x10E3/uL   Immature Granulocytes 0 %   Immature Grans (Abs) 0.0 0.0 - 0.1 x10E3/uL  Lipid Profile  Result Value Ref Range   Cholesterol, Total 176 100 - 199 mg/dL   Triglycerides 141 0 - 149 mg/dL   HDL 49 >39 mg/dL   VLDL Cholesterol Cal 28 5 - 40 mg/dL   LDL Calculated 99 0 - 99 mg/dL   Chol/HDL Ratio 3.6 0.0 - 5.0 ratio units  Specimen status report  Result Value Ref Range   specimen status report Comment   Hgb A1c w/o eAG  Result Value Ref Range   Hgb A1c MFr Bld 6.2 (H) 4.8 - 5.6 %      Assessment & Plan:   Problem List Items Addressed This Visit    None    Visit Diagnoses    Acute maxillary sinusitis, recurrence not specified    -  Primary    Treat for sinusitis given duration of symptoms. Augmentin BID x 5 days. Continue mucinex. Alarm symptoms reviewed. Return if not improving.     Relevant Medications    amoxicillin-clavulanate (AUGMENTIN) 875-125 MG tablet    dextromethorphan-guaiFENesin  (MUCINEX DM) 30-600 MG 12hr tablet    benzonatate (TESSALON) 100 MG capsule    Cough        2/2 drainage. No indication for CXR today. Alarm symptoms reviewed.        Meds ordered this encounter  Medications  . amoxicillin-clavulanate (AUGMENTIN) 875-125 MG tablet    Sig: Take 1 tablet by mouth 2 (two) times daily. For 5 days.    Dispense:  10 tablet    Refill:  0    Order Specific Question:  Supervising Provider    Answer:  Arlis Porta [id number]  . dextromethorphan-guaiFENesin (MUCINEX DM) 30-600 MG 12hr tablet    Sig: Take 1 tablet by mouth 2 (two) times daily.    Dispense:  20 tablet    Refill:  0    Order Specific Question:  Supervising Provider    Answer:  Arlis Porta [phone]  . benzonatate (TESSALON) 100 MG capsule    Sig: Take 1 capsule (100 mg total) by mouth at bedtime as needed.    Dispense:  10 capsule    Refill:  0    Order Specific Question:  Supervising Provider    Answer:  Arlis Porta [id number]      Follow up plan: Return if symptoms worsen or fail to improve.

## 2016-04-11 DIAGNOSIS — R7309 Other abnormal glucose: Secondary | ICD-10-CM | POA: Diagnosis not present

## 2016-04-11 DIAGNOSIS — I10 Essential (primary) hypertension: Secondary | ICD-10-CM | POA: Diagnosis not present

## 2016-04-12 LAB — HEMOGLOBIN A1C
Est. average glucose Bld gHb Est-mCnc: 134 mg/dL
HEMOGLOBIN A1C: 6.3 % — AB (ref 4.8–5.6)

## 2016-04-12 LAB — BASIC METABOLIC PANEL
BUN / CREAT RATIO: 24 (ref 10–24)
BUN: 20 mg/dL (ref 10–36)
CO2: 23 mmol/L (ref 18–29)
Calcium: 8.2 mg/dL — ABNORMAL LOW (ref 8.6–10.2)
Chloride: 99 mmol/L (ref 96–106)
Creatinine, Ser: 0.85 mg/dL (ref 0.76–1.27)
GFR calc Af Amer: 88 mL/min/{1.73_m2} (ref >59)
GFR calc non Af Amer: 76 mL/min/{1.73_m2} (ref >59)
GLUCOSE: 136 mg/dL — AB (ref 65–99)
POTASSIUM: 4.4 mmol/L (ref 3.5–5.2)
SODIUM: 138 mmol/L (ref 134–144)

## 2016-05-28 DIAGNOSIS — H353211 Exudative age-related macular degeneration, right eye, with active choroidal neovascularization: Secondary | ICD-10-CM | POA: Diagnosis not present

## 2016-06-19 ENCOUNTER — Ambulatory Visit: Payer: Medicare Other | Admitting: Family Medicine

## 2016-06-25 ENCOUNTER — Ambulatory Visit (INDEPENDENT_AMBULATORY_CARE_PROVIDER_SITE_OTHER): Payer: Medicare Other | Admitting: Family Medicine

## 2016-06-25 ENCOUNTER — Encounter: Payer: Self-pay | Admitting: Family Medicine

## 2016-06-25 VITALS — BP 140/70 | HR 66 | Temp 97.7°F | Resp 16 | Ht 67.0 in | Wt 155.0 lb

## 2016-06-25 DIAGNOSIS — K2100 Gastro-esophageal reflux disease with esophagitis, without bleeding: Secondary | ICD-10-CM

## 2016-06-25 DIAGNOSIS — E785 Hyperlipidemia, unspecified: Secondary | ICD-10-CM

## 2016-06-25 DIAGNOSIS — R7303 Prediabetes: Secondary | ICD-10-CM | POA: Diagnosis not present

## 2016-06-25 DIAGNOSIS — I10 Essential (primary) hypertension: Secondary | ICD-10-CM | POA: Diagnosis not present

## 2016-06-25 DIAGNOSIS — J302 Other seasonal allergic rhinitis: Secondary | ICD-10-CM | POA: Diagnosis not present

## 2016-06-25 MED ORDER — LISINOPRIL 20 MG PO TABS: 20.0000 mg | ORAL_TABLET | Freq: Every day | ORAL | Status: DC

## 2016-06-25 MED ORDER — FLUTICASONE PROPIONATE 50 MCG/ACT NA SUSP: 2.0000 | Freq: Every day | NASAL | Status: DC

## 2016-06-25 MED ORDER — PRAVASTATIN SODIUM 20 MG PO TABS: ORAL_TABLET | ORAL | Status: DC

## 2016-06-25 MED ORDER — OMEPRAZOLE 20 MG PO CPDR: 20.0000 mg | DELAYED_RELEASE_CAPSULE | Freq: Two times a day (BID) | ORAL | Status: DC

## 2016-06-25 NOTE — Progress Notes (Signed)
Name: Travis Mccoy   MRN: [id number]    DOB: 06/27/1922   Date:06/25/2016       Progress Note  Subjective  Chief Complaint  Chief Complaint  Patient presents with  . Hypertension  . Hyperlipidemia  . Gastroesophageal Reflux    HPI Here for f/u of HBP.  Also with hyperlipidemia, GERD, and seasonal allergies.  Needs all med refilled.  Does not have his hearing aid in this morning.  Very hard of hearing.  No problem-specific assessment & plan notes found for this encounter.   Past Medical History  Diagnosis Date  . Hypertension   . Hyperlipidemia   . GERD (gastroesophageal reflux disease)     Past Surgical History  Procedure Laterality Date  . Hernia repair  2009    Family History  Problem Relation Age of Onset  . Stroke Father     Social History   Social History  . Marital Status: Married    Spouse Name: N/A  . Number of Children: N/A  . Years of Education: N/A   Occupational History  . Not on file.   Social History Main Topics  . Smoking status: Former Smoker    Types: Cigarettes    Quit date: 08/30/1959  . Smokeless tobacco: Never Used  . Alcohol Use: Yes     Comment: beers  . Drug Use: No  . Sexual Activity: Not on file   Other Topics Concern  . Not on file   Social History Narrative     Current outpatient prescriptions:  .  aspirin 81 MG tablet, Take 81 mg by mouth daily., Disp: , Rfl:  .  fluticasone (FLONASE) 50 MCG/ACT nasal spray, Place 2 sprays into both nostrils daily., Disp: 48 g, Rfl: 12 .  lisinopril (PRINIVIL,ZESTRIL) 20 MG tablet, Take 1 tablet (20 mg total) by mouth daily., Disp: 90 tablet, Rfl: 3 .  omeprazole (PRILOSEC) 20 MG capsule, Take 1 capsule (20 mg total) by mouth 2 (two) times daily before a meal., Disp: 180 capsule, Rfl: 3 .  pravastatin (PRAVACHOL) 20 MG tablet, Take 1 tablet by mouth every other evening, Disp: 90 tablet, Rfl: 3  Not on File   Review of Systems  Constitutional: Negative for fever, chills, weight  loss and malaise/fatigue.  HENT: Negative for hearing loss.   Eyes: Negative for blurred vision and double vision.  Respiratory: Negative for cough, shortness of breath and wheezing.   Cardiovascular: Negative for chest pain, palpitations and leg swelling.  Gastrointestinal: Negative for heartburn, abdominal pain and blood in stool.  Genitourinary: Negative for dysuria, urgency and frequency.  Skin: Negative for rash.  Neurological: Negative for dizziness, tremors, weakness and headaches.      Objective  Filed Vitals:   06/25/16 0807 06/25/16 0822  BP: 178/72 140/70  Pulse: 55 66  Temp: 97.7 F (36.5 C)   TempSrc: Oral   Resp: 16   Height: 5\' 7"  (1.702 m)   Weight: 155 lb (70.308 kg)     Physical Exam  Constitutional: He is oriented to person, place, and time and well-developed, well-nourished, and in no distress. No distress.  HENT:  Head: Normocephalic and atraumatic.  Nose: Nose normal.  Mouth/Throat: Oropharynx is clear and moist.  Eyes: Conjunctivae and EOM are normal. Pupils are equal, round, and reactive to light. No scleral icterus.  Neck: Normal range of motion. Neck supple. No thyromegaly present.  Cardiovascular: Normal rate, regular rhythm and normal heart sounds.  Exam reveals no gallop and no friction  rub.   No murmur heard. Pulmonary/Chest: Effort normal and breath sounds normal. No respiratory distress. He has no wheezes. He has no rales.  Abdominal: Soft. Bowel sounds are normal. He exhibits no distension and no mass. There is no tenderness.  Musculoskeletal: He exhibits no edema.  Lymphadenopathy:    He has no cervical adenopathy.  Neurological: He is alert and oriented to person, place, and time.  Vitals reviewed.      Recent Results (from the past 2160 hour(s))  HgB A1c     Status: Abnormal   Collection Time: 04/11/16  1:35 PM  Result Value Ref Range   Hgb A1c MFr Bld 6.3 (H) 4.8 - 5.6 %    Comment:          Pre-diabetes: 5.7 - 6.4           Diabetes: >6.4          Glycemic control for adults with diabetes: <7.0    Est. average glucose Bld gHb Est-mCnc 134 mg/dL  Basic metabolic panel     Status: Abnormal   Collection Time: 04/11/16  1:35 PM  Result Value Ref Range   Glucose 136 (H) 65 - 99 mg/dL   BUN 20 10 - 36 mg/dL   Creatinine, Ser 0.85 0.76 - 1.27 mg/dL   GFR calc non Af Amer 76 >59 mL/min/1.73   GFR calc Af Amer 88 >59 mL/min/1.73   BUN/Creatinine Ratio 24 10 - 24   Sodium 138 134 - 144 mmol/L   Potassium 4.4 3.5 - 5.2 mmol/L   Chloride 99 96 - 106 mmol/L   CO2 23 18 - 29 mmol/L   Calcium 8.2 (L) 8.6 - 10.2 mg/dL     Assessment & Plan  Problem List Items Addressed This Visit      Cardiovascular and Mediastinum   Hypertension - Primary   Relevant Medications   lisinopril (PRINIVIL,ZESTRIL) 20 MG tablet   pravastatin (PRAVACHOL) 20 MG tablet     Respiratory   Seasonal allergies   Relevant Medications   fluticasone (FLONASE) 50 MCG/ACT nasal spray     Digestive   GERD (gastroesophageal reflux disease)   Relevant Medications   omeprazole (PRILOSEC) 20 MG capsule     Other   Hyperlipidemia   Relevant Medications   lisinopril (PRINIVIL,ZESTRIL) 20 MG tablet   pravastatin (PRAVACHOL) 20 MG tablet   Pre-diabetes      Meds ordered this encounter  Medications  . lisinopril (PRINIVIL,ZESTRIL) 20 MG tablet    Sig: Take 1 tablet (20 mg total) by mouth daily.    Dispense:  90 tablet    Refill:  3  . fluticasone (FLONASE) 50 MCG/ACT nasal spray    Sig: Place 2 sprays into both nostrils daily.    Dispense:  48 g    Refill:  12  . omeprazole (PRILOSEC) 20 MG capsule    Sig: Take 1 capsule (20 mg total) by mouth 2 (two) times daily before a meal.    Dispense:  180 capsule    Refill:  3  . pravastatin (PRAVACHOL) 20 MG tablet    Sig: Take 1 tablet by mouth every other evening    Dispense:  90 tablet    Refill:  3   1. Essential hypertension  - lisinopril (PRINIVIL,ZESTRIL) 20 MG tablet; Take 1  tablet (20 mg total) by mouth daily.  Dispense: 90 tablet; Refill: 3  2. Seasonal allergies  - fluticasone (FLONASE) 50 MCG/ACT nasal spray; Place 2 sprays into  both nostrils daily.  Dispense: 48 g; Refill: 12  3. Gastroesophageal reflux disease with esophagitis  - omeprazole (PRILOSEC) 20 MG capsule; Take 1 capsule (20 mg total) by mouth 2 (two) times daily before a meal.  Dispense: 180 capsule; Refill: 3  4. Hyperlipidemia  - pravastatin (PRAVACHOL) 20 MG tablet; Take 1 tablet by mouth every other evening  Dispense: 90 tablet; Refill: 3  5. Pre-diabetes RTC-2 months

## 2016-06-25 NOTE — Patient Instructions (Signed)
Get A1c on return

## 2016-07-16 DIAGNOSIS — H2513 Age-related nuclear cataract, bilateral: Secondary | ICD-10-CM | POA: Diagnosis not present

## 2016-07-16 DIAGNOSIS — H353211 Exudative age-related macular degeneration, right eye, with active choroidal neovascularization: Secondary | ICD-10-CM | POA: Diagnosis not present

## 2016-09-15 ENCOUNTER — Encounter: Payer: Self-pay | Admitting: Family Medicine

## 2016-09-15 ENCOUNTER — Ambulatory Visit (INDEPENDENT_AMBULATORY_CARE_PROVIDER_SITE_OTHER): Payer: Medicare Other | Admitting: Family Medicine

## 2016-09-15 VITALS — BP 135/60 | HR 56 | Temp 97.6°F | Resp 16 | Ht 67.0 in | Wt 154.0 lb

## 2016-09-15 DIAGNOSIS — K2100 Gastro-esophageal reflux disease with esophagitis, without bleeding: Secondary | ICD-10-CM

## 2016-09-15 DIAGNOSIS — Z23 Encounter for immunization: Secondary | ICD-10-CM | POA: Diagnosis not present

## 2016-09-15 DIAGNOSIS — R7309 Other abnormal glucose: Secondary | ICD-10-CM

## 2016-09-15 DIAGNOSIS — K219 Gastro-esophageal reflux disease without esophagitis: Secondary | ICD-10-CM | POA: Diagnosis not present

## 2016-09-15 DIAGNOSIS — I10 Essential (primary) hypertension: Secondary | ICD-10-CM

## 2016-09-15 DIAGNOSIS — R7303 Prediabetes: Secondary | ICD-10-CM

## 2016-09-15 DIAGNOSIS — E785 Hyperlipidemia, unspecified: Secondary | ICD-10-CM | POA: Diagnosis not present

## 2016-09-15 LAB — CBC WITH DIFFERENTIAL/PLATELET
BASOS PCT: 1 %
Basophils Absolute: 79 cells/uL (ref 0–200)
EOS ABS: 158 {cells}/uL (ref 15–500)
Eosinophils Relative: 2 %
HCT: 43.6 % (ref 38.5–50.0)
Hemoglobin: 14.9 g/dL (ref 13.2–17.1)
Lymphocytes Relative: 26 %
Lymphs Abs: 2054 cells/uL (ref 850–3900)
MCH: 32.3 pg (ref 27.0–33.0)
MCHC: 34.2 g/dL (ref 32.0–36.0)
MCV: 94.6 fL (ref 80.0–100.0)
MONO ABS: 790 {cells}/uL (ref 200–950)
MONOS PCT: 10 %
MPV: 10.2 fL (ref 7.5–12.5)
NEUTROS ABS: 4819 {cells}/uL (ref 1500–7800)
Neutrophils Relative %: 61 %
PLATELETS: 184 10*3/uL (ref 140–400)
RBC: 4.61 MIL/uL (ref 4.20–5.80)
RDW: 13.4 % (ref 11.0–15.0)
WBC: 7.9 10*3/uL (ref 3.8–10.8)

## 2016-09-15 LAB — POCT GLYCOSYLATED HEMOGLOBIN (HGB A1C)

## 2016-09-15 MED ORDER — OMEPRAZOLE 20 MG PO CPDR: 20.0000 mg | DELAYED_RELEASE_CAPSULE | Freq: Every day | ORAL | 3 refills | Status: DC

## 2016-09-15 MED ORDER — LISINOPRIL 20 MG PO TABS: 20.0000 mg | ORAL_TABLET | Freq: Every day | ORAL | 3 refills | Status: DC

## 2016-09-15 MED ORDER — PRAVASTATIN SODIUM 20 MG PO TABS: ORAL_TABLET | ORAL | 3 refills | Status: DC

## 2016-09-15 NOTE — Progress Notes (Signed)
Name: Travis Mccoy   MRN: [id number]    DOB: [birth year]   Date:09/15/2016       Progress Note  Subjective  Chief Complaint  Chief Complaint  Patient presents with  . Hypertension    HPI Here for f/u of HBP and pre-diabetes.  He is watching his diet and limiting starches and fats as much as possible.  He is feeling sewll without c/o.  He requests his flu shot today.  No problem-specific Assessment & Plan notes found for this encounter.   Past Medical History:  Diagnosis Date  . GERD (gastroesophageal reflux disease)   . Hyperlipidemia   . Hypertension     Past Surgical History:  Procedure Laterality Date  . HERNIA REPAIR  2009    Family History  Problem Relation Age of Onset  . Stroke Father     Social History   Social History  . Marital status: Married    Spouse name: N/A  . Number of children: N/A  . Years of education: N/A   Occupational History  . Not on file.   Social History Main Topics  . Smoking status: Former Smoker    Types: Cigarettes    Quit date: 08/30/1959  . Smokeless tobacco: Never Used  . Alcohol use Yes     Comment: beers  . Drug use: No  . Sexual activity: Not on file   Other Topics Concern  . Not on file   Social History Narrative  . No narrative on file     Current Outpatient Prescriptions:  .  aspirin 81 MG tablet, Take 81 mg by mouth daily., Disp: , Rfl:  .  fluticasone (FLONASE) 50 MCG/ACT nasal spray, Place 2 sprays into both nostrils daily., Disp: 48 g, Rfl: 12 .  lisinopril (PRINIVIL,ZESTRIL) 20 MG tablet, Take 1 tablet (20 mg total) by mouth daily., Disp: 90 tablet, Rfl: 3 .  omeprazole (PRILOSEC) 20 MG capsule, Take 1 capsule (20 mg total) by mouth daily., Disp: 90 capsule, Rfl: 3 .  pravastatin (PRAVACHOL) 20 MG tablet, Take 1 tablet by mouth every other evening, Disp: 90 tablet, Rfl: 3  Not on File   Review of Systems  Constitutional: Negative for chills, fever, malaise/fatigue and weight loss.  HENT: Negative for  hearing loss.   Eyes: Negative for blurred vision and double vision.  Respiratory: Negative for cough, shortness of breath and wheezing.   Cardiovascular: Negative for chest pain, palpitations and leg swelling.  Gastrointestinal: Negative for abdominal pain, blood in stool and heartburn.  Genitourinary: Negative for dysuria, frequency and urgency.       Nocturia  Musculoskeletal: Negative for joint pain and myalgias.  Skin: Negative for rash.  Neurological: Negative for dizziness, tremors, weakness and headaches.  Psychiatric/Behavioral: Negative for depression. The patient is not nervous/anxious and does not have insomnia.       Objective  Vitals:   09/15/16 0901 09/15/16 0941  BP: (!) 153/71 135/60  Pulse: (!) 56   Resp: 16   Temp: 97.6 F (36.4 C)   TempSrc: Oral   Weight: 154 lb (69.9 kg)   Height: 5\' 7"  (1.702 m)     Physical Exam  Constitutional: He is oriented to person, place, and time and well-developed, well-nourished, and in no distress. No distress.  HENT:  Head: Normocephalic and atraumatic.  Eyes: Conjunctivae and EOM are normal. Pupils are equal, round, and reactive to light. No scleral icterus.  Neck: Normal range of motion. Neck supple. Carotid bruit is not  present. No thyromegaly present.  Cardiovascular: Normal rate, regular rhythm and normal heart sounds.   Occasional extrasystoles are present. Exam reveals no gallop and no friction rub.   No murmur heard. Pulmonary/Chest: Effort normal and breath sounds normal. No respiratory distress. He has no wheezes. He has no rales.  Abdominal: Soft. Bowel sounds are normal. He exhibits no distension, no abdominal bruit and no mass. There is no tenderness.  Musculoskeletal: He exhibits no edema.  Lymphadenopathy:    He has no cervical adenopathy.  Neurological: He is alert and oriented to person, place, and time.  Vitals reviewed.      Recent Results (from the past 2160 hour(s))  POCT HgB A1C     Status:  Abnormal   Collection Time: 09/15/16  9:15 AM  Result Value Ref Range   Hemoglobin A1C 6.2%      Assessment & Plan  Problem List Items Addressed This Visit      Cardiovascular and Mediastinum   Hypertension   Relevant Medications   lisinopril (PRINIVIL,ZESTRIL) 20 MG tablet   pravastatin (PRAVACHOL) 20 MG tablet   Other Relevant Orders   COMPLETE METABOLIC PANEL WITH GFR     Digestive   GERD (gastroesophageal reflux disease)   Relevant Medications   omeprazole (PRILOSEC) 20 MG capsule   Other Relevant Orders   CBC with Differential     Other   Hyperlipidemia   Relevant Medications   lisinopril (PRINIVIL,ZESTRIL) 20 MG tablet   pravastatin (PRAVACHOL) 20 MG tablet   Other Relevant Orders   Lipid Profile   Pre-diabetes    Other Visit Diagnoses    Elevated glucose    -  Primary   Relevant Orders   POCT HgB A1C (Completed)   Immunization due       Relevant Orders   Flu vaccine HIGH DOSE PF (Fluzone High dose)      Meds ordered this encounter  Medications  . lisinopril (PRINIVIL,ZESTRIL) 20 MG tablet    Sig: Take 1 tablet (20 mg total) by mouth daily.    Dispense:  90 tablet    Refill:  3  . pravastatin (PRAVACHOL) 20 MG tablet    Sig: Take 1 tablet by mouth every other evening    Dispense:  90 tablet    Refill:  3  . omeprazole (PRILOSEC) 20 MG capsule    Sig: Take 1 capsule (20 mg total) by mouth daily.    Dispense:  90 capsule    Refill:  3   1. Elevated glucose  - POCT HgB A1C-6.2  2. Immunization due  - Flu vaccine HIGH DOSE PF (Fluzone High dose)  3. Essential hypertension  - COMPLETE METABOLIC PANEL WITH GFR - lisinopril (PRINIVIL,ZESTRIL) 20 MG tablet; Take 1 tablet (20 mg total) by mouth daily.  Dispense: 90 tablet; Refill: 3  4. Gastroesophageal reflux disease without esophagitis   5. Hyperlipidemia  - Lipid Profile - pravastatin (PRAVACHOL) 20 MG tablet; Take 1 tablet by mouth every other evening  Dispense: 90 tablet; Refill:  3  6. Pre-diabetes Cont to watch diet.  7. Gastroesophageal reflux disease with esophagitis  - CBC with Differential - omeprazole (PRILOSEC) 20 MG capsule; Take 1 capsule (20 mg total) by mouth daily.  Dispense: 90 capsule; Refill: 3

## 2016-09-16 LAB — COMPLETE METABOLIC PANEL WITH GFR
ALT: 14 U/L (ref 9–46)
AST: 22 U/L (ref 10–35)
Albumin: 4 g/dL (ref 3.6–5.1)
Alkaline Phosphatase: 64 U/L (ref 40–115)
BILIRUBIN TOTAL: 0.9 mg/dL (ref 0.2–1.2)
BUN: 17 mg/dL (ref 7–25)
CHLORIDE: 106 mmol/L (ref 98–110)
CO2: 27 mmol/L (ref 20–31)
Calcium: 9.5 mg/dL (ref 8.6–10.3)
Creat: 0.88 mg/dL (ref 0.70–1.11)
GFR, EST AFRICAN AMERICAN: 87 mL/min (ref >=60)
GFR, EST NON AFRICAN AMERICAN: 75 mL/min (ref >=60)
Glucose, Bld: 106 mg/dL — ABNORMAL HIGH (ref 65–99)
Potassium: 4.9 mmol/L (ref 3.5–5.3)
Sodium: 140 mmol/L (ref 135–146)
Total Protein: 6.5 g/dL (ref 6.1–8.1)

## 2016-09-16 LAB — LIPID PANEL
Cholesterol: 166 mg/dL (ref 125–200)
HDL: 51 mg/dL (ref >=40)
LDL CALC: 82 mg/dL (ref <130)
TRIGLYCERIDES: 166 mg/dL — AB (ref <150)
Total CHOL/HDL Ratio: 3.3 Ratio (ref <=5.0)
VLDL: 33 mg/dL — AB (ref <30)

## 2016-10-01 DIAGNOSIS — H353211 Exudative age-related macular degeneration, right eye, with active choroidal neovascularization: Secondary | ICD-10-CM | POA: Diagnosis not present

## 2016-12-10 DIAGNOSIS — H353211 Exudative age-related macular degeneration, right eye, with active choroidal neovascularization: Secondary | ICD-10-CM | POA: Diagnosis not present

## 2017-02-25 DIAGNOSIS — H353211 Exudative age-related macular degeneration, right eye, with active choroidal neovascularization: Secondary | ICD-10-CM | POA: Diagnosis not present

## 2017-04-07 ENCOUNTER — Ambulatory Visit: Payer: Medicare Other | Admitting: Family Medicine

## 2017-04-08 ENCOUNTER — Ambulatory Visit: Payer: Medicare Other | Admitting: Family Medicine

## 2017-04-14 ENCOUNTER — Ambulatory Visit (INDEPENDENT_AMBULATORY_CARE_PROVIDER_SITE_OTHER): Payer: Medicare Other | Admitting: Family Medicine

## 2017-04-14 ENCOUNTER — Encounter: Payer: Self-pay | Admitting: Family Medicine

## 2017-04-14 VITALS — BP 155/50 | HR 54 | Temp 97.8°F | Resp 16 | Ht 67.0 in | Wt 160.8 lb

## 2017-04-14 DIAGNOSIS — X32XXXA Exposure to sunlight, initial encounter: Secondary | ICD-10-CM | POA: Diagnosis not present

## 2017-04-14 DIAGNOSIS — L570 Actinic keratosis: Secondary | ICD-10-CM | POA: Diagnosis not present

## 2017-04-14 DIAGNOSIS — E782 Mixed hyperlipidemia: Secondary | ICD-10-CM | POA: Diagnosis not present

## 2017-04-14 DIAGNOSIS — K219 Gastro-esophageal reflux disease without esophagitis: Secondary | ICD-10-CM | POA: Diagnosis not present

## 2017-04-14 DIAGNOSIS — I10 Essential (primary) hypertension: Secondary | ICD-10-CM

## 2017-04-14 NOTE — Assessment & Plan Note (Signed)
Stable, well controlled on PRN intermittent PPI dosing No known complications Continue current dose, not due for refill

## 2017-04-14 NOTE — Patient Instructions (Signed)
Thank you for coming in to clinic today.  1. Keep up the good work - Stay active - Continue current meds, no change  2. Referral to Dermatology - if you do not get an appointment within next 2 weeks then you can call them to check status  [facility] [address], [place] [address] Phone: [phone]  Dr Kirkland Hun  -------------------  You will be due for FASTING BLOOD WORK (no food or drink after midnight before, only water or coffee without cream/sugar on the morning of)  - Please go ahead and schedule a "Lab Only" visit in the morning at the clinic for lab draw in 5 months next Annual Physical - Make sure Lab Only appointment is at least 1-2 weeks before your next appointment, so that results will be available  For Lab Results, once available within 2-3 days of blood draw, you can can log in to MyChart online to view your results and a brief explanation. Also, we can discuss results at next follow-up visit.  Please schedule a follow-up appointment with Dr. Parks Ranger in 5 months for Annual Physical  If you have any other questions or concerns, please feel free to call the clinic or send a message through [facility]. You may also schedule an earlier appointment if necessary.  [name], DO [place]

## 2017-04-14 NOTE — Assessment & Plan Note (Signed)
Mild elevated BP, did not take med today, home readings improved No known complication  Plan: 1. Continue current med - Lisinopril 20mg  daily 2. Encouraged continue improved lifestyle, stay active exercise, low sodium diet 3. Check future labs chemistry, lipids, A1c approx 5 months for annual physical 4. Follow-up 5 months

## 2017-04-14 NOTE — Assessment & Plan Note (Signed)
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 - Referral to [facility] for establish routine skin checks and would benefit from cryotherapy for AKs

## 2017-04-14 NOTE — Progress Notes (Signed)
Subjective:    Patient ID: Travis Mccoy, male    DOB: 1922-05-08, 83 y.o.   MRN: [id number]  Travis Mccoy is a 83 y.o. male presenting on 04/14/2017 for Hypertension   HPI   CHRONIC HTN: Reports no concerns. Has BP cuff at home only rarely checks it. Current Meds - Lisinopril 73m   Reports good compliance, did not take med today. Tolerating well, w/o complaints. Lifestyle: - Stays active around house, has garden - Eats balanced diet Denies CP, dyspnea, HA, edema, dizziness / lightheadedness  HYPERLIPIDEMIA: - Reports no concerns. Last lipid panel 08/2016, controlled except elevated TG - Currently taking Pravastatin 248mone pill every 3 days, tolerating well without side effects or myalgias - Taking Aspirin 8171maily  GERD Takes occasional Omeprazole 90m46m needed about once every 2-3 weeks PRN for heartburn - Denies abdominal pain, loss of appetite, nausea, vomiting  Actinic Keratoses / Chronic Excess Sun Exposure - Long history of sun exposure outdoors, prior sun burns. Prior Dermatology with Dr LuptAllyson Sabal HendKoleen Nimrodd Dr KowaRoxanne Gatesthe past. Now seeking a new Dermatologist locally to re-establish with for routine checks and for cryotherapy for several lesions on face and scalp with dry rough spots - No known history of Skin Cancer - Denies significant known abnormal mole or ulcerated non healing spot  Social History  Substance Use Topics  . Smoking status: Former Smoker    Types: Cigarettes    Quit date: 08/30/1959  . Smokeless tobacco: Never Used  . Alcohol use Yes     Comment: beers    Review of Systems Per HPI unless specifically indicated above     Objective:    BP (!) 155/50   Pulse (!) 54   Temp 97.8 F (36.6 C) (Oral)   Resp 16   Ht 5' 7"  (1.702 m)   Wt 160 lb 12.8 oz (72.9 kg)   BMI 25.18 kg/m   Wt Readings from Last 3 Encounters:  04/14/17 160 lb 12.8 oz (72.9 kg)  09/15/16 154 lb (69.9 kg)  06/25/16 155 lb (70.3 kg)    Physical Exam    Constitutional: He appears well-developed and well-nourished. No distress.  Well-appearing, comfortable, cooperative, pleasant  HENT:  Head: Normocephalic and atraumatic.  Mouth/Throat: Oropharynx is clear and moist.  Eyes: Conjunctivae are normal.  Neck: Normal range of motion. Neck supple. No thyromegaly present.  Cardiovascular: Normal rate, regular rhythm, normal heart sounds and intact distal pulses.   No murmur heard. Pulmonary/Chest: Effort normal and breath sounds normal. No respiratory distress. He has no wheezes. He has no rales.  Lymphadenopathy:    He has no cervical adenopathy.  Neurological: He is alert.  Skin: Skin is warm and dry. No rash noted. He is not diaphoretic. No erythema.  Scattered non pigmented rough skin spots across face and forehead and ear lobes bilaterally, most consistent with actinic keratoses, evidence of damaged skin from sun, without obvious ulceration or skin lesion  Psychiatric: He has a normal mood and affect. His behavior is normal.  Well groomed, good eye contact, normal speech and thoughts  Nursing note and vitals reviewed.   I have personally reviewed the following lab results from 09/15/16.  Results for orders placed or performed in visit on 09/15/16  Lipid Profile  Result Value Ref Range   Cholesterol 166 125 - 200 mg/dL   Triglycerides 166 (H) <150 mg/dL   HDL 51 >=40 mg/dL   Total CHOL/HDL Ratio 3.3 <=5.0 Ratio  VLDL 33 (H) <30 mg/dL   LDL Cholesterol 82 <130 mg/dL  COMPLETE METABOLIC PANEL WITH GFR  Result Value Ref Range   Sodium 140 135 - 146 mmol/L   Potassium 4.9 3.5 - 5.3 mmol/L   Chloride 106 98 - 110 mmol/L   CO2 27 20 - 31 mmol/L   Glucose, Bld 106 (H) 65 - 99 mg/dL   BUN 17 7 - 25 mg/dL   Creat 0.88 0.70 - 1.11 mg/dL   Total Bilirubin 0.9 0.2 - 1.2 mg/dL   Alkaline Phosphatase 64 40 - 115 U/L   AST 22 10 - 35 U/L   ALT 14 9 - 46 U/L   Total Protein 6.5 6.1 - 8.1 g/dL   Albumin 4.0 3.6 - 5.1 g/dL   Calcium 9.5 8.6  - 10.3 mg/dL   GFR, Est African American 87 >=60 mL/min   GFR, Est Non African American 75 >=60 mL/min  CBC with Differential  Result Value Ref Range   WBC 7.9 3.8 - 10.8 K/uL   RBC 4.61 4.20 - 5.80 MIL/uL   Hemoglobin 14.9 13.2 - 17.1 g/dL   HCT 43.6 38.5 - 50.0 %   MCV 94.6 80.0 - 100.0 fL   MCH 32.3 27.0 - 33.0 pg   MCHC 34.2 32.0 - 36.0 g/dL   RDW 13.4 11.0 - 15.0 %   Platelets 184 140 - 400 K/uL   MPV 10.2 7.5 - 12.5 fL   Neutro Abs 4,819 1,500 - 7,800 cells/uL   Lymphs Abs 2,054 850 - 3,900 cells/uL   Monocytes Absolute 790 200 - 950 cells/uL   Eosinophils Absolute 158 15 - 500 cells/uL   Basophils Absolute 79 0 - 200 cells/uL   Neutrophils Relative % 61 %   Lymphocytes Relative 26 %   Monocytes Relative 10 %   Eosinophils Relative 2 %   Basophils Relative 1 %   Smear Review Criteria for review not met   POCT HgB A1C  Result Value Ref Range   Hemoglobin A1C 6.2%       Assessment & Plan:   Problem List Items Addressed This Visit    Hypertension - Primary    Mild elevated BP, did not take med today, home readings improved No known complication  Plan: 1. Continue current med - Lisinopril 1m daily 2. Encouraged continue improved lifestyle, stay active exercise, low sodium diet 3. Check future labs chemistry, lipids, A1c approx 5 months for annual physical 4. Follow-up 5 months      Hyperlipidemia    Last lipids 08/2016, elevated TG otherwise controlled LDL/HDL on intermittent statin dosing  Plan: 1. Continue current Pravastatin 236mq 3 days 2. Continue ASA 819maily 3. Encouraged improve lifestyle - exercise 4. Follow-up 5 months future fasting lipids and labs annual physical - calculated ASCVD risk score      GERD (gastroesophageal reflux disease)    Stable, well controlled on PRN intermittent PPI dosing No known complications Continue current dose, not due for refill      Actinic keratoses    Several scattered lesions palpable on face, scalp and  ears consistent with AKs from chronic sun exposure. - [facility] and would benefit from cryotherapy for AKs      Relevant Orders   Ambulatory referral to Dermatology    Other Visit Diagnoses    Excess sun exposure       Relevant Orders   Ambulatory referral to Dermatology  No orders of the defined types were placed in this encounter.   Follow up plan: Return in about 5 months (around 09/14/2017) for Annual Physical.  [name], DO [facility] 04/14/2017, 11:20 PM

## 2017-04-14 NOTE — Assessment & Plan Note (Addendum)
Last lipids 08/2016, elevated TG otherwise controlled LDL/HDL on intermittent statin dosing  Plan: 1. Continue current Pravastatin 20mg  q 3 days 2. Continue ASA 81mg  daily 3. Encouraged improve lifestyle - exercise 4. Follow-up 5 months future fasting lipids and labs annual physical - calculated ASCVD risk score

## 2017-04-15 ENCOUNTER — Other Ambulatory Visit: Payer: Self-pay | Admitting: Family Medicine

## 2017-04-15 DIAGNOSIS — E782 Mixed hyperlipidemia: Secondary | ICD-10-CM

## 2017-04-15 DIAGNOSIS — I10 Essential (primary) hypertension: Secondary | ICD-10-CM

## 2017-04-15 DIAGNOSIS — R7303 Prediabetes: Secondary | ICD-10-CM

## 2017-04-15 DIAGNOSIS — Z0000 Encounter for general adult medical examination without abnormal findings: Secondary | ICD-10-CM

## 2017-08-11 ENCOUNTER — Ambulatory Visit (INDEPENDENT_AMBULATORY_CARE_PROVIDER_SITE_OTHER): Payer: Medicare Other

## 2017-08-11 VITALS — BP 130/72 | HR 78 | Temp 97.8°F | Resp 16 | Ht 65.0 in | Wt 156.8 lb

## 2017-08-11 DIAGNOSIS — Z0000 Encounter for general adult medical examination without abnormal findings: Secondary | ICD-10-CM | POA: Diagnosis not present

## 2017-08-11 NOTE — Progress Notes (Signed)
Subjective:   Travis Mccoy is a 83 y.o. male who presents for Medicare Annual/Subsequent preventive examination.  Review of Systems:  Cardiac Risk Factors include: advanced age (>28men, >67 women);dyslipidemia;hypertension     Objective:    Vitals: BP 130/72 (BP Location: Left Arm, Patient Position: Sitting)   Pulse 78   Temp 97.8 F (36.6 C)   Resp 16   Ht 5\' 5"  (1.651 m)   Wt 156 lb 12.8 oz (71.1 kg)   BMI 26.09 kg/m   Body mass index is 26.09 kg/m.  Tobacco History  Smoking Status  . Former Smoker  . Types: Cigarettes  . Quit date: 08/30/1959  Smokeless Tobacco  . Never Used     Counseling given: Not Answered   Past Medical History:  Diagnosis Date  . GERD (gastroesophageal reflux disease)   . Hyperlipidemia   . Hypertension    Past Surgical History:  Procedure Laterality Date  . HERNIA REPAIR  2009   Family History  Problem Relation Age of Onset  . Stroke Father    History  Sexual Activity  . Sexual activity: Not on file    Outpatient Encounter Prescriptions as of 08/11/2017  Medication Sig  . aspirin 81 MG tablet Take 81 mg by mouth daily.  . fluticasone (FLONASE) 50 MCG/ACT nasal spray Place 2 sprays into both nostrils daily.  Marland Kitchen lisinopril (PRINIVIL,ZESTRIL) 20 MG tablet Take 1 tablet (20 mg total) by mouth daily.  Marland Kitchen omeprazole (PRILOSEC) 20 MG capsule Take 1 capsule (20 mg total) by mouth daily.  Vladimir Faster Glycol-Propyl Glycol 0.4-0.3 % SOLN Apply to eye.  . pravastatin (PRAVACHOL) 20 MG tablet Take 1 tablet by mouth every other evening   No facility-administered encounter medications on file as of 08/11/2017.     Activities of Daily Living In your present state of health, do you have any difficulty performing the following activities: 08/11/2017  Hearing? Y  Vision? N  Difficulty concentrating or making decisions? N  Walking or climbing stairs? N  Dressing or bathing? N  Doing errands, shopping? N  Preparing Food and eating ? N  Using  the Toilet? N  In the past six months, have you accidently leaked urine? N  Do you have problems with loss of bowel control? N  Managing your Medications? N  Managing your Finances? N  Housekeeping or managing your Housekeeping? N  Comment has an aid that helps  Some recent data might be hidden    Patient Care Team: Olin Hauser, DO as PCP - General (Family Medicine)   Assessment:     Exercise Activities and Dietary recommendations Current Exercise Habits: The patient does not participate in regular exercise at present, Exercise limited by: None identified  Goals    . cleaning          Would like to clean rifles and put away for winter.     . Increase water intake          Recommend drinking at least 5-6 glasses of water a day      Fall Risk Fall Risk  08/11/2017 09/15/2016 06/25/2016 04/10/2016 04/03/2016  Falls in the past year? No No No No No  Number falls in past yr: - - - - -  Injury with Fall? - - - - -  Risk for fall due to : - - - - -   Depression Screen PHQ 2/9 Scores 08/11/2017 09/15/2016 06/25/2016 04/10/2016  PHQ - 2 Score 0 0 0 0  Cognitive Function MMSE - Mini Mental State Exam 01/29/2016  Orientation to time 5  Orientation to Place 5  Registration 3  Attention/ Calculation 5  Recall 0  Language- name 2 objects 2  Language- repeat 1  Language- follow 3 step command 3  Language- read & follow direction 1  Write a sentence 1  Copy design 1  Total score 27     6CIT Screen 08/11/2017  What Year? 0 points  What month? 0 points  What time? 0 points  Count back from 20 0 points  Months in reverse 0 points  Repeat phrase 2 points  Total Score 2    Immunization History  Administered Date(s) Administered  . Influenza Whole 09/26/2010  . Influenza, High Dose Seasonal PF 10/03/2015, 09/15/2016  . Influenza-Unspecified 10/10/2014  . Pneumococcal Conjugate-13 09/27/2014  . Pneumococcal Polysaccharide-23 12/30/2007  . Tdap 01/05/2015    Screening Tests Health Maintenance  Topic Date Due  . INFLUENZA VACCINE  07/29/2017  . TETANUS/TDAP  01/05/2025  . PNA vac Low Risk Adult  Completed      Plan:    I have personally reviewed and addressed the Medicare Annual Wellness questionnaire and have noted the following in the patient's chart:  A. Medical and social history B. Use of alcohol, tobacco or illicit drugs  C. Current medications and supplements D. Functional ability and status E.  Nutritional status F.  Physical activity G. Advance directives H. List of other physicians I.  Hospitalizations, surgeries, and ER visits in previous 12 months J.  [place] such as hearing and vision if needed, cognitive and depression L. Referrals and appointments   In addition, I have reviewed and discussed with patient certain preventive protocols, quality metrics, and best practice recommendations. A written personalized care plan for preventive services as well as general preventive health recommendations were provided to patient.   Signed,  Tyler Aas, LPN Nurse Health Advisor   MD Recommendations: none

## 2017-08-11 NOTE — Patient Instructions (Signed)
Travis Mccoy , Thank you for taking time to come for your Medicare Wellness Visit. I appreciate your ongoing commitment to your health goals. Please review the following plan we discussed and let me know if I can assist you in the future.   Screening recommendations/referrals: Colonoscopy: no longer required Recommended yearly ophthalmology/optometry visit for glaucoma screening and checkup Recommended yearly dental visit for hygiene and checkup  Vaccinations: Influenza vaccine: up to date, due 08/2017 Pneumococcal vaccine: up to date Tdap vaccine: up to date Shingles vaccine:due, check with your insurance company for coverage    Advanced directives: Advance directive discussed with you today. I have provided a copy for you to complete at home and have notarized. Once this is complete please bring a copy in to our office so we can scan it into your chart.  Conditions/risks identified: Recommend drinking at least 5-6 glasses of water a day  Next appointment:  Follow up on 09/07/2017 at 8:15am for lab work.   Follow up on 09/14/2017 at 9:00 with Dr.Karamalegos.  Follow up in one year for your annual wellness exam  Preventive Care 65 Years and Older, Male Preventive care refers to lifestyle choices and visits with your health care provider that can promote health and wellness. What does preventive care include?  A yearly physical exam. This is also called an annual well check.  Dental exams once or twice a year.  Routine eye exams. Ask your health care provider how often you should have your eyes checked.  Personal lifestyle choices, including:  Daily care of your teeth and gums.  Regular physical activity.  Eating a healthy diet.  Avoiding tobacco and drug use.  Limiting alcohol use.  Practicing safe sex.  Taking low doses of aspirin every day.  Taking vitamin and mineral supplements as recommended by your health care provider. What happens during an annual well check? The  services and screenings done by your health care provider during your annual well check will depend on your age, overall health, lifestyle risk factors, and family history of disease. Counseling  Your health care provider may ask you questions about your:  Alcohol use.  Tobacco use.  Drug use.  Emotional well-being.  Home and relationship well-being.  Sexual activity.  Eating habits.  History of falls.  Memory and ability to understand (cognition).  Work and work Statistician. Screening  You may have the following tests or measurements:  Height, weight, and BMI.  Blood pressure.  Lipid and cholesterol levels. These may be checked every 5 years, or more frequently if you are over 75 years old.  Skin check.  Lung cancer screening. You may have this screening every year starting at age 67 if you have a 30-pack-year history of smoking and currently smoke or have quit within the past 15 years.  Fecal occult blood test (FOBT) of the stool. You may have this test every year starting at age 39.  Flexible sigmoidoscopy or colonoscopy. You may have a sigmoidoscopy every 5 years or a colonoscopy every 10 years starting at age 67.  Prostate cancer screening. Recommendations will vary depending on your family history and other risks.  Hepatitis C blood test.  Hepatitis B blood test.  Sexually transmitted disease (STD) testing.  Diabetes screening. This is done by checking your blood sugar (glucose) after you have not eaten for a while (fasting). You may have this done every 1-3 years.  Abdominal aortic aneurysm (AAA) screening. You may need this if you are a current or  former smoker.  Osteoporosis. You may be screened starting at age 35 if you are at high risk. Talk with your health care provider about your test results, treatment options, and if necessary, the need for more tests. Vaccines  Your health care provider may recommend certain vaccines, such as:  Influenza  vaccine. This is recommended every year.  Tetanus, diphtheria, and acellular pertussis (Tdap, Td) vaccine. You may need a Td booster every 10 years.  Zoster vaccine. You may need this after age 51.  Pneumococcal 13-valent conjugate (PCV13) vaccine. One dose is recommended after age 20.  Pneumococcal polysaccharide (PPSV23) vaccine. One dose is recommended after age 52. Talk to your health care provider about which screenings and vaccines you need and how often you need them. This information is not intended to replace advice given to you by your health care provider. Make sure you discuss any questions you have with your health care provider. Document Released: 01/11/2016 Document Revised: 09/03/2016 Document Reviewed: 10/16/2015 Elsevier Interactive Patient Education  2017 [place] Prevention in the Home Falls can cause injuries. They can happen to people of all ages. There are many things you can do to make your home safe and to help prevent falls. What can I do on the outside of my home?  Regularly fix the edges of walkways and driveways and fix any cracks.  Remove anything that might make you trip as you walk through a door, such as a raised step or threshold.  Trim any bushes or trees on the path to your home.  Use bright outdoor lighting.  Clear any walking paths of anything that might make someone trip, such as rocks or tools.  Regularly check to see if handrails are loose or broken. Make sure that both sides of any steps have handrails.  Any raised decks and porches should have guardrails on the edges.  Have any leaves, snow, or ice cleared regularly.  Use sand or salt on walking paths during winter.  Clean up any spills in your garage right away. This includes oil or grease spills. What can I do in the bathroom?  Use night lights.  Install grab bars by the toilet and in the tub and shower. Do not use towel bars as grab bars.  Use non-skid mats or decals  in the tub or shower.  If you need to sit down in the shower, use a plastic, non-slip stool.  Keep the floor dry. Clean up any water that spills on the floor as soon as it happens.  Remove soap buildup in the tub or shower regularly.  Attach bath mats securely with double-sided non-slip rug tape.  Do not have throw rugs and other things on the floor that can make you trip. What can I do in the bedroom?  Use night lights.  Make sure that you have a light by your bed that is easy to reach.  Do not use any sheets or blankets that are too big for your bed. They should not hang down onto the floor.  Have a firm chair that has side arms. You can use this for support while you get dressed.  Do not have throw rugs and other things on the floor that can make you trip. What can I do in the kitchen?  Clean up any spills right away.  Avoid walking on wet floors.  Keep items that you use a lot in easy-to-reach places.  If you need to reach something above you, use  a strong step stool that has a grab bar.  Keep electrical cords out of the way.  Do not use floor polish or wax that makes floors slippery. If you must use wax, use non-skid floor wax.  Do not have throw rugs and other things on the floor that can make you trip. What can I do with my stairs?  Do not leave any items on the stairs.  Make sure that there are handrails on both sides of the stairs and use them. Fix handrails that are broken or loose. Make sure that handrails are as long as the stairways.  Check any carpeting to make sure that it is firmly attached to the stairs. Fix any carpet that is loose or worn.  Avoid having throw rugs at the top or bottom of the stairs. If you do have throw rugs, attach them to the floor with carpet tape.  Make sure that you have a light switch at the top of the stairs and the bottom of the stairs. If you do not have them, ask someone to add them for you. What else can I do to help  prevent falls?  Wear shoes that:  Do not have high heels.  Have rubber bottoms.  Are comfortable and fit you well.  Are closed at the toe. Do not wear sandals.  If you use a stepladder:  Make sure that it is fully opened. Do not climb a closed stepladder.  Make sure that both sides of the stepladder are locked into place.  Ask someone to hold it for you, if possible.  Clearly mark and make sure that you can see:  Any grab bars or handrails.  First and last steps.  Where the edge of each step is.  Use tools that help you move around (mobility aids) if they are needed. These include:  Canes.  Walkers.  Scooters.  Crutches.  Turn on the lights when you go into a dark area. Replace any light bulbs as soon as they burn out.  Set up your furniture so you have a clear path. Avoid moving your furniture around.  If any of your floors are uneven, fix them.  If there are any pets around you, be aware of where they are.  Review your medicines with your doctor. Some medicines can make you feel dizzy. This can increase your chance of falling. Ask your doctor what other things that you can do to help prevent falls. This information is not intended to replace advice given to you by your health care provider. Make sure you discuss any questions you have with your health care provider. Document Released: 10/11/2009 Document Revised: 05/22/2016 Document Reviewed: 01/19/2015 Elsevier Interactive Patient Education  2017 [employer].

## 2017-09-07 ENCOUNTER — Other Ambulatory Visit: Payer: Medicare Other

## 2017-09-14 ENCOUNTER — Encounter: Payer: Medicare Other | Admitting: Family Medicine

## 2017-10-08 ENCOUNTER — Ambulatory Visit (INDEPENDENT_AMBULATORY_CARE_PROVIDER_SITE_OTHER): Payer: Medicare Other | Admitting: Family Medicine

## 2017-10-08 ENCOUNTER — Encounter: Payer: Self-pay | Admitting: Family Medicine

## 2017-10-08 VITALS — BP 103/50 | HR 86 | Temp 98.4°F | Resp 16 | Ht 67.0 in | Wt 155.8 lb

## 2017-10-08 DIAGNOSIS — E782 Mixed hyperlipidemia: Secondary | ICD-10-CM | POA: Diagnosis not present

## 2017-10-08 DIAGNOSIS — K2100 Gastro-esophageal reflux disease with esophagitis, without bleeding: Secondary | ICD-10-CM

## 2017-10-08 DIAGNOSIS — R7303 Prediabetes: Secondary | ICD-10-CM | POA: Diagnosis not present

## 2017-10-08 DIAGNOSIS — Z23 Encounter for immunization: Secondary | ICD-10-CM | POA: Diagnosis not present

## 2017-10-08 DIAGNOSIS — I10 Essential (primary) hypertension: Secondary | ICD-10-CM

## 2017-10-08 MED ORDER — PRAVASTATIN SODIUM 20 MG PO TABS: ORAL_TABLET | ORAL | 3 refills | Status: DC

## 2017-10-08 MED ORDER — LISINOPRIL 20 MG PO TABS: 20.0000 mg | ORAL_TABLET | Freq: Every day | ORAL | 3 refills | Status: DC

## 2017-10-08 MED ORDER — OMEPRAZOLE 20 MG PO CPDR: 20.0000 mg | DELAYED_RELEASE_CAPSULE | Freq: Every day | ORAL | 3 refills | Status: DC

## 2017-10-08 NOTE — Patient Instructions (Addendum)
Thank you for coming to the clinic today.  1. Recommend checking with [facility] or ordering a Blood Pressure Cuff to monitor BP  2. For muscle cramps, - increase vegetables, pears/apples for potassium, and extra water for hydration  Also can try natural remedy - Try spoonful of yellow mustard to relieve leg cramps or try daily to prevent the problem  - OTC natural option is Hyland's Leg Cramps (Dissolving tablet) take as needed for muscle cramps  3.   DUE for FASTING BLOOD WORK (no food or drink after midnight before the lab appointment, only water or coffee without cream/sugar on the morning of)  SCHEDULE "Lab Only" visit in the morning at the clinic for lab draw in 1 day  - Make sure Lab Only appointment is at about 1 week before your next appointment, so that results will be available  For Lab Results, once available within 2-3 days of blood draw, you can can log in to MyChart online to view your results and a brief explanation. Also, we can discuss results at next follow-up visit.  Please schedule a Follow-up Appointment to: Return in about 6 months (around 04/08/2018) for HTN, Pre-DM A1c, GERD.  If you have any other questions or concerns, please feel free to call the clinic or send a message through [place]. You may also schedule an earlier appointment if necessary.  Additionally, you may be receiving a survey about your experience at our clinic within a few days to 1 week by e-mail or mail. We value your feedback.  [name], DO [facility]

## 2017-10-08 NOTE — Progress Notes (Signed)
Subjective:    Patient ID: Travis Mccoy, male    DOB: January 11, 1922, 83 y.o.   MRN: [id number]  Travis Mccoy is a 83 y.o. male presenting on 10/08/2017 for Hypertension   HPI   Here for Annual Physical. Has not completed fasting labs, will return.  CHRONIC HTN: Reports no concerns. Not checking BP at home, has old cuff may try to get new one Current Meds - Lisinopril 24m   Reports good compliance, did not take med today. Tolerating well, w/o complaints. Lifestyle: - Diet: Balanced, no significant change since last visit - Exercise: Stays active around house and garden  HYPERLIPIDEMIA: - Reports no concerns. Last lipid panel 08/2016, controlled except elevated TG - Currently taking Pravastatin 278mone pill every 3 days, tolerating well without side effects or myalgias - Taking Aspirin 8137maily - Admits mild occasional leg cramp, not associated to statin use  Pre-DM No diet change since last time. Not checking CBG Meds: None Currently on ACEi   GERD No new concerns. Doing well. Still taking occasional Omeprazole 53m24m needed often in evening PRN for heartburn  Health Maintenance: - Due for Flu Shot, will receive today - UTD Pneumonia Vaccine. UTD TDap - Offered PSA screening, reviewed risk/benefits of screening, and false positive with potential indication for prostate biopsy, currently asymptomatic, patient requests to opt out of lab screening  Depression screen PHQ [facility] 10/08/2017 08/11/2017 09/15/2016  Decreased Interest 0 0 0  Down, Depressed, Hopeless 0 0 0  PHQ - 2 Score 0 0 0    Past Medical History:  Diagnosis Date  . GERD (gastroesophageal reflux disease)   . Hyperlipidemia   . Hypertension    Past Surgical History:  Procedure Laterality Date  . HERNIA REPAIR  2009   Social History   Social History  . Marital status: Married    Spouse name: N/A  . Number of children: N/A  . Years of education: N/A   Occupational History  . Not on file.    Social History Main Topics  . Smoking status: Former Smoker    Types: Cigarettes    Quit date: 08/30/1959  . Smokeless tobacco: Never Used  . Alcohol use 0.6 oz/week    1 Cans of beer per week     Comment: beer 3-4 a month   . Drug use: No  . Sexual activity: Not on file   Other Topics Concern  . Not on file   Social History Narrative  . No narrative on file   Family History  Problem Relation Age of Onset  . Stroke Father    Current Outpatient Prescriptions on File Prior to Visit  Medication Sig  . aspirin 81 MG tablet Take 81 mg by mouth daily.  . fluticasone (FLONASE) 50 MCG/ACT nasal spray Place 2 sprays into both nostrils daily.  . PoVladimir Fastercol-Propyl Glycol 0.4-0.3 % SOLN Apply to eye.   No current facility-administered medications on file prior to visit.     Review of Systems  Constitutional: Negative for activity change, appetite change, chills, diaphoresis, fatigue and fever.  HENT: Positive for hearing loss. Negative for congestion and sinus pressure.   Eyes: Negative for visual disturbance.  Respiratory: Negative for apnea, cough, choking, chest tightness, shortness of breath and wheezing.   Cardiovascular: Negative for chest pain, palpitations and leg swelling.  Gastrointestinal: Negative for abdominal distention, abdominal pain, anal bleeding, blood in stool, constipation, diarrhea, nausea and vomiting.       Occasional heartburn  Endocrine: Negative for cold intolerance and polyuria.  Genitourinary: Negative for decreased urine volume, difficulty urinating, dysuria, frequency, hematuria and urgency.  Musculoskeletal: Negative for arthralgias, back pain and neck pain.  Skin: Negative for rash.  Allergic/Immunologic: Negative for environmental allergies.  Neurological: Negative for dizziness, weakness, light-headedness, numbness and headaches.  Hematological: Negative for adenopathy.  Psychiatric/Behavioral: Negative for behavioral problems, dysphoric  mood and sleep disturbance.   Per HPI unless specifically indicated above     Objective:    BP (!) 103/50   Pulse 86   Temp 98.4 F (36.9 C) (Oral)   Resp 16   Ht 5' 7"  (1.702 m)   Wt 155 lb 12.8 oz (70.7 kg)   BMI 24.40 kg/m   Wt Readings from Last 3 Encounters:  10/08/17 155 lb 12.8 oz (70.7 kg)  08/11/17 156 lb 12.8 oz (71.1 kg)  04/14/17 160 lb 12.8 oz (72.9 kg)    Physical Exam  Constitutional: He is oriented to person, place, and time. He appears well-developed and well-nourished. No distress.  Well-appearing elderly 83 year old male, comfortable, cooperative  HENT:  Head: Normocephalic and atraumatic.  Mouth/Throat: Oropharynx is clear and moist.  Frontal / maxillary sinuses non-tender. Nares patent without purulence or edema. Oropharynx clear without erythema, exudates, edema or asymmetry.  Eyes: Pupils are equal, round, and reactive to light. Conjunctivae and EOM are normal. Right eye exhibits no discharge. Left eye exhibits no discharge.  Neck: Normal range of motion. Neck supple. No thyromegaly present.  Cardiovascular: Normal rate, regular rhythm, normal heart sounds and intact distal pulses.   No murmur heard. Pulmonary/Chest: Effort normal and breath sounds normal. No respiratory distress. He has no wheezes. He has no rales.  Abdominal: Soft. Bowel sounds are normal. He exhibits no distension and no mass. There is no tenderness.  Musculoskeletal: Normal range of motion. He exhibits no edema or tenderness.  Upper / Lower Extremities: - Normal muscle tone, strength bilateral upper extremities 5/5, lower extremities 5/5  Lymphadenopathy:    He has no cervical adenopathy.  Neurological: He is alert and oriented to person, place, and time.  Distal sensation intact to light touch all extremities  Skin: Skin is warm and dry. No rash noted. He is not diaphoretic. No erythema.  Psychiatric: He has a normal mood and affect. His behavior is normal.  Well groomed, good  eye contact, normal speech and thoughts  Nursing note and vitals reviewed.  Results for orders placed or performed in visit on 09/15/16  Lipid Profile  Result Value Ref Range   Cholesterol 166 125 - 200 mg/dL   Triglycerides 166 (H) <150 mg/dL   HDL 51 >=40 mg/dL   Total CHOL/HDL Ratio 3.3 <=5.0 Ratio   VLDL 33 (H) <30 mg/dL   LDL Cholesterol 82 <130 mg/dL  COMPLETE METABOLIC PANEL WITH GFR  Result Value Ref Range   Sodium 140 135 - 146 mmol/L   Potassium 4.9 3.5 - 5.3 mmol/L   Chloride 106 98 - 110 mmol/L   CO2 27 20 - 31 mmol/L   Glucose, Bld 106 (H) 65 - 99 mg/dL   BUN 17 7 - 25 mg/dL   Creat 0.88 0.70 - 1.11 mg/dL   Total Bilirubin 0.9 0.2 - 1.2 mg/dL   Alkaline Phosphatase 64 40 - 115 U/L   AST 22 10 - 35 U/L   ALT 14 9 - 46 U/L   Total Protein 6.5 6.1 - 8.1 g/dL   Albumin 4.0 3.6 - 5.1 g/dL  Calcium 9.5 8.6 - 10.3 mg/dL   GFR, Est African American 87 >=60 mL/min   GFR, Est Non African American 75 >=60 mL/min  CBC with Differential  Result Value Ref Range   WBC 7.9 3.8 - 10.8 K/uL   RBC 4.61 4.20 - 5.80 MIL/uL   Hemoglobin 14.9 13.2 - 17.1 g/dL   HCT 43.6 38.5 - 50.0 %   MCV 94.6 80.0 - 100.0 fL   MCH 32.3 27.0 - 33.0 pg   MCHC 34.2 32.0 - 36.0 g/dL   RDW 13.4 11.0 - 15.0 %   Platelets 184 140 - 400 K/uL   MPV 10.2 7.5 - 12.5 fL   Neutro Abs 4,819 1,500 - 7,800 cells/uL   Lymphs Abs 2,054 850 - 3,900 cells/uL   Monocytes Absolute 790 200 - 950 cells/uL   Eosinophils Absolute 158 15 - 500 cells/uL   Basophils Absolute 79 0 - 200 cells/uL   Neutrophils Relative % 61 %   Lymphocytes Relative 26 %   Monocytes Relative 10 %   Eosinophils Relative 2 %   Basophils Relative 1 %   Smear Review Criteria for review not met   POCT HgB A1C  Result Value Ref Range   Hemoglobin A1C 6.2%          Assessment & Plan:   Problem List Items Addressed This Visit    GERD (gastroesophageal reflux disease)    Stable, remains well controlled on PRN PM dose of PPI No  known complications Follow-up q 6 mo      Relevant Medications   omeprazole (PRILOSEC) 20 MG capsule   Hyperlipidemia    Last lipids 08/2016, elevated TG otherwise controlled LDL/HDL on intermittent statin dosing  Plan: 1. Continue current Pravastatin 84m q 3 days 2. Continue ASA 864mdaily 3. Encouraged improve lifestyle - exercise 4. Follow-up fasting labs within 1 week, then anticipate lipids yearly - return q 6 mo      Relevant Medications   lisinopril (PRINIVIL,ZESTRIL) 20 MG tablet   pravastatin (PRAVACHOL) 20 MG tablet   Other Relevant Orders   Lipid panel   Hypertension - Primary    Well-controlled HTN. Mild low today - Home BP readings none available  No known complications  Plan:  1. Continue current BP regimen - Lisinopril 2037maily 2. Encourage improved lifestyle - low sodium diet, regular exercise 3. Start to monitor BP more frequently at home 4. Follow-up q 6 mo      Relevant Medications   lisinopril (PRINIVIL,ZESTRIL) 20 MG tablet   pravastatin (PRAVACHOL) 20 MG tablet   Other Relevant Orders   COMPLETE METABOLIC PANEL WITH GFR   CBC with Differential/Platelet   Pre-diabetes    Stable but mild elevated A1c for Pre-DM 6.2 to 6.4 over past 2 years  Plan:  1. Not on any therapy currently  2. Encourage improved lifestyle - remain active, healthy diet choices 3. Follow-up q 6 mo, A1c      Relevant Orders   Hemoglobin A1c    Other Visit Diagnoses    Needs flu shot       Relevant Orders   Flu vaccine HIGH DOSE PF (Fluzone High dose) (Completed)      Meds ordered this encounter  Medications  . lisinopril (PRINIVIL,ZESTRIL) 20 MG tablet    Sig: Take 1 tablet (20 mg total) by mouth daily.    Dispense:  90 tablet    Refill:  3  . omeprazole (PRILOSEC) 20 MG capsule  Sig: Take 1 capsule (20 mg total) by mouth daily.    Dispense:  90 capsule    Refill:  3  . pravastatin (PRAVACHOL) 20 MG tablet    Sig: Take 1 tablet by mouth every other evening     Dispense:  90 tablet    Refill:  3    Follow up plan: Return in about 6 months (around 04/08/2018) for HTN, Pre-DM A1c, GERD.  [name], DO [facility] 10/09/2017, 9:59 PM

## 2017-10-09 NOTE — Assessment & Plan Note (Signed)
Well-controlled HTN. Mild low today - Home BP readings none available  No known complications  Plan:  1. Continue current BP regimen - Lisinopril 20mg  daily 2. Encourage improved lifestyle - low sodium diet, regular exercise 3. Start to monitor BP more frequently at home 4. Follow-up q 6 mo

## 2017-10-09 NOTE — Assessment & Plan Note (Signed)
Stable but mild elevated A1c for Pre-DM 6.2 to 6.4 over past 2 years  Plan:  1. Not on any therapy currently  2. Encourage improved lifestyle - remain active, healthy diet choices 3. Follow-up q 6 mo, A1c

## 2017-10-09 NOTE — Assessment & Plan Note (Signed)
Last lipids 08/2016, elevated TG otherwise controlled LDL/HDL on intermittent statin dosing  Plan: 1. Continue current Pravastatin 20mg  q 3 days 2. Continue ASA 81mg  daily 3. Encouraged improve lifestyle - exercise 4. Follow-up fasting labs within 1 week, then anticipate lipids yearly - return q 6 mo

## 2017-10-09 NOTE — Assessment & Plan Note (Signed)
Stable, remains well controlled on PRN PM dose of PPI No known complications Follow-up q 6 mo

## 2017-10-13 LAB — COMPLETE METABOLIC PANEL WITH GFR
AG RATIO: 1.6 (calc) (ref 1.0–2.5)
ALKALINE PHOSPHATASE (APISO): 98 U/L (ref 40–115)
ALT: 14 U/L (ref 9–46)
AST: 22 U/L (ref 10–35)
Albumin: 3.9 g/dL (ref 3.6–5.1)
BILIRUBIN TOTAL: 0.7 mg/dL (ref 0.2–1.2)
BUN: 19 mg/dL (ref 7–25)
CALCIUM: 9.3 mg/dL (ref 8.6–10.3)
CHLORIDE: 103 mmol/L (ref 98–110)
CO2: 29 mmol/L (ref 20–32)
CREATININE: 0.99 mg/dL (ref 0.70–1.11)
GFR, Est African American: 76 mL/min/{1.73_m2} (ref >=60)
GFR, Est Non African American: 65 mL/min/{1.73_m2} (ref >=60)
Globulin: 2.5 g/dL (calc) (ref 1.9–3.7)
Glucose, Bld: 130 mg/dL — ABNORMAL HIGH (ref 65–99)
Potassium: 4.5 mmol/L (ref 3.5–5.3)
SODIUM: 141 mmol/L (ref 135–146)
Total Protein: 6.4 g/dL (ref 6.1–8.1)

## 2017-10-13 LAB — CBC WITH DIFFERENTIAL/PLATELET
BASOS ABS: 79 {cells}/uL (ref 0–200)
BASOS PCT: 1.2 %
EOS ABS: 211 {cells}/uL (ref 15–500)
Eosinophils Relative: 3.2 %
HCT: 39.5 % (ref 38.5–50.0)
HEMOGLOBIN: 13.5 g/dL (ref 13.2–17.1)
LYMPHS ABS: 1723 {cells}/uL (ref 850–3900)
MCH: 32.1 pg (ref 27.0–33.0)
MCHC: 34.2 g/dL (ref 32.0–36.0)
MCV: 94 fL (ref 80.0–100.0)
MPV: 10.8 fL (ref 7.5–12.5)
Monocytes Relative: 9.4 %
NEUTROS ABS: 3967 {cells}/uL (ref 1500–7800)
Neutrophils Relative %: 60.1 %
Platelets: 214 10*3/uL (ref 140–400)
RBC: 4.2 10*6/uL (ref 4.20–5.80)
RDW: 12.8 % (ref 11.0–15.0)
Total Lymphocyte: 26.1 %
WBC mixed population: 620 cells/uL (ref 200–950)
WBC: 6.6 10*3/uL (ref 3.8–10.8)

## 2017-10-13 LAB — HEMOGLOBIN A1C
EAG (MMOL/L): 7.4 (calc)
HEMOGLOBIN A1C: 6.3 %{Hb} — AB (ref <5.7)
MEAN PLASMA GLUCOSE: 134 (calc)

## 2017-10-13 LAB — LIPID PANEL
CHOL/HDL RATIO: 3.3 (calc) (ref <5.0)
CHOLESTEROL: 187 mg/dL (ref <200)
HDL: 57 mg/dL (ref >40)
LDL Cholesterol (Calc): 108 mg/dL (calc) — ABNORMAL HIGH
Non-HDL Cholesterol (Calc): 130 mg/dL (calc) — ABNORMAL HIGH (ref <130)
Triglycerides: 114 mg/dL (ref <150)

## 2017-10-14 ENCOUNTER — Telehealth: Payer: Self-pay | Admitting: Family Medicine

## 2017-10-14 NOTE — Telephone Encounter (Signed)
Pt return call °

## 2018-03-03 ENCOUNTER — Encounter: Payer: Self-pay | Admitting: Family Medicine

## 2018-03-03 ENCOUNTER — Ambulatory Visit: Payer: Medicare Other | Admitting: Family Medicine

## 2018-03-03 ENCOUNTER — Other Ambulatory Visit: Payer: Self-pay | Admitting: Family Medicine

## 2018-03-03 VITALS — BP 126/70 | HR 103 | Temp 97.6°F | Resp 16 | Ht 67.0 in | Wt 156.0 lb

## 2018-03-03 DIAGNOSIS — E782 Mixed hyperlipidemia: Secondary | ICD-10-CM

## 2018-03-03 DIAGNOSIS — I10 Essential (primary) hypertension: Secondary | ICD-10-CM

## 2018-03-03 DIAGNOSIS — Z0000 Encounter for general adult medical examination without abnormal findings: Secondary | ICD-10-CM

## 2018-03-03 DIAGNOSIS — R1310 Dysphagia, unspecified: Secondary | ICD-10-CM | POA: Diagnosis not present

## 2018-03-03 DIAGNOSIS — L570 Actinic keratosis: Secondary | ICD-10-CM

## 2018-03-03 DIAGNOSIS — R7303 Prediabetes: Secondary | ICD-10-CM

## 2018-03-03 DIAGNOSIS — K219 Gastro-esophageal reflux disease without esophagitis: Secondary | ICD-10-CM

## 2018-03-03 NOTE — Progress Notes (Signed)
Subjective:    Patient ID: Travis Mccoy, male    DOB: 01-20-1922, 84 y.o.   MRN: [id number]  Travis Mccoy is a 84 y.o. male presenting on 03/03/2018 for facial spot (as per patiet hard to swollow when eat something needs to drink plenty of water to swollow)   HPI   Actinic Keratoses / Chronic Excess Sun Exposure - Last visit with me for this same problem in 03/2017, treated with referral to local Derm (Dr Land at [place]), see prior notes for background information. - Interval update with patient has seen them once and still has same problem - Today he reports now requested to change providers he would like to return to Dr Allyson Sabal (Dermatology in [place]), he used to be former patient but no longer returned, it has been several years - He admits same problem rough irritated skin spots on face and scalp and chest - Long history of sun exposure outdoors, prior sun burns. - He is requesting routine checks and for cryotherapy for several lesions on face and scalp with dry rough spots - No known history of Skin Cancer - Denies significant known abnormal mole or ulcerated non healing spot  Additional complaint:  Possible Dysphagia Reports that his family was worried about some episodes of coughing without choking after eating food and concern some food getting stuck in throat. If eats dry foods sometimes will feel like it gets stuck in throat, will drink water or milk and it improves quickly. Has a cough occasionally to help clear, duration over past 6 months, not worse and not resolved, no prior episodes before. - Never had EGD or laryngoscopy - Denies pain, unintentional wt loss, nausea vomiting, choking, unable to swallow, weakness numbness tingling  Depression screen [facility] 2/9 10/08/2017 08/11/2017 09/15/2016  Decreased Interest 0 0 0  Down, Depressed, Hopeless 0 0 0  PHQ - 2 Score 0 0 0    Social History   Tobacco Use  . Smoking status: Former Smoker    Types:  Cigarettes    Last attempt to quit: 08/30/1959    Years since quitting: 58.5  . Smokeless tobacco: Never Used  Substance Use Topics  . Alcohol use: Yes    Alcohol/week: 0.6 oz    Types: 1 Cans of beer per week    Comment: beer 3-4 a month   . Drug use: No    Review of Systems Per HPI unless specifically indicated above     Objective:    BP 126/70   Pulse (!) 103   Temp 97.6 F (36.4 C) (Oral)   Resp 16   Ht 5\' 7"  (1.702 m)   Wt 156 lb (70.8 kg)   BMI 24.43 kg/m   Wt Readings from Last 3 Encounters:  03/03/18 156 lb (70.8 kg)  10/08/17 155 lb 12.8 oz (70.7 kg)  08/11/17 156 lb 12.8 oz (71.1 kg)    Physical Exam  Constitutional: He appears well-developed and well-nourished. No distress.  Well-appearing, comfortable, cooperative, pleasant  HENT:  Head: Normocephalic and atraumatic.  Mouth/Throat: Oropharynx is clear and moist.  No obvious abnormality of soft palate  Eyes: Conjunctivae are normal.  Neck: Normal range of motion. Neck supple. No thyromegaly present.  No masses  Demonstrated swallowing mechanism  Cardiovascular: Normal rate, regular rhythm, normal heart sounds and intact distal pulses.  No murmur heard. Pulmonary/Chest: Effort normal and breath sounds normal. No respiratory distress. He has no wheezes. He has no rales.  Lymphadenopathy:  He has no cervical adenopathy.  Neurological: He is alert.  Skin: Skin is warm and dry. No rash noted. He is not diaphoretic. No erythema.  Similar to previous exam 03/2017 - Scattered non pigmented rough skin spots across face and forehead and ear lobes bilaterally, most consistent with actinic keratoses, evidence of damaged skin from sun, without obvious ulceration or skin lesion  One similar location central upper chest wall  Psychiatric: He has a normal mood and affect. His behavior is normal.  Well groomed, good eye contact, normal speech and thoughts  Nursing note and vitals reviewed.  Results for orders placed  or performed in visit on 04/15/17  COMPLETE METABOLIC PANEL WITH GFR  Result Value Ref Range   Glucose, Bld 130 (H) 65 - 99 mg/dL   BUN 19 7 - 25 mg/dL   Creat 0.99 0.70 - 1.11 mg/dL   GFR, Est Non African American 65 > OR = 60 mL/min/1.59m2   GFR, Est African American 76 > OR = 60 mL/min/1.66m2   BUN/Creatinine Ratio NOT APPLICABLE 6 - 22 (calc)   Sodium 141 135 - 146 mmol/L   Potassium 4.5 3.5 - 5.3 mmol/L   Chloride 103 98 - 110 mmol/L   CO2 29 20 - 32 mmol/L   Calcium 9.3 8.6 - 10.3 mg/dL   Total Protein 6.4 6.1 - 8.1 g/dL   Albumin 3.9 3.6 - 5.1 g/dL   Globulin 2.5 1.9 - 3.7 g/dL (calc)   AG Ratio 1.6 1.0 - 2.5 (calc)   Total Bilirubin 0.7 0.2 - 1.2 mg/dL   Alkaline phosphatase (APISO) 98 40 - 115 U/L   AST 22 10 - 35 U/L   ALT 14 9 - 46 U/L  Hemoglobin A1c  Result Value Ref Range   Hgb A1c MFr Bld 6.3 (H) <5.7 % of total Hgb   Mean Plasma Glucose 134 (calc)   eAG (mmol/L) 7.4 (calc)  Lipid panel  Result Value Ref Range   Cholesterol 187 <200 mg/dL   HDL 57 >40 mg/dL   Triglycerides 114 <150 mg/dL   LDL Cholesterol (Calc) 108 (H) mg/dL (calc)   Total CHOL/HDL Ratio 3.3 <5.0 (calc)   Non-HDL Cholesterol (Calc) 130 (H) <130 mg/dL (calc)  CBC with Differential/Platelet  Result Value Ref Range   WBC 6.6 3.8 - 10.8 Thousand/uL   RBC 4.20 4.20 - 5.80 Million/uL   Hemoglobin 13.5 13.2 - 17.1 g/dL   HCT 39.5 38.5 - 50.0 %   MCV 94.0 80.0 - 100.0 fL   MCH 32.1 27.0 - 33.0 pg   MCHC 34.2 32.0 - 36.0 g/dL   RDW 12.8 11.0 - 15.0 %   Platelets 214 140 - 400 Thousand/uL   MPV 10.8 7.5 - 12.5 fL   Neutro Abs 3,967 1,500 - 7,800 cells/uL   Lymphs Abs 1,723 850 - 3,900 cells/uL   WBC mixed population 620 200 - 950 cells/uL   Eosinophils Absolute 211 15 - 500 cells/uL   Basophils Absolute 79 0 - 200 cells/uL   Neutrophils Relative % 60.1 %   Total Lymphocyte 26.1 %   Monocytes Relative 9.4 %   Eosinophils Relative 3.2 %   Basophils Relative 1.2 %      Assessment & Plan:     Problem List Items Addressed This Visit    Actinic keratoses - Primary    Unresolved problem Several scattered lesions palpable on face, scalp and ears consistent with AKs from chronic sun exposure Previously referred to [facility], now  he prefers to return to previous Dermatologist Dr Allyson Sabal in [place] - Requesting cryotherapy for multiple AKs      Relevant Orders   Ambulatory referral to Dermatology    Other Visit Diagnoses    Dysphagia, unspecified type     Uncertain exact etiology, most of history is benign, but now persistent issue intermittent, seems easily resolved, no other red flag symptoms GI or systemic. Reassurance, reviewed precautions if any worsening or concerns, expressed that if interested we could pursue this more aggressively with GI vs ENT eval vs swallow study in future       No orders of the defined types were placed in this encounter.   Orders Placed This Encounter  Procedures  . Ambulatory referral to Dermatology    Referral Priority:   Routine    Referral Type:   Consultation    Referral Reason:   Specialty Services Required    Referred to Provider:   Druscilla Brownie, MD    Requested Specialty:   Dermatology    Number of Visits Requested:   1      Follow up plan: Return in about 7 months (around 10/12/2018) for Annual Physical.  He may keep apt already scheduled in April 2019 if he prefers.  Will order future blood tests for October 2019  [name], [facility] 03/03/2018, 10:52 PM

## 2018-03-03 NOTE — Assessment & Plan Note (Signed)
Unresolved problem Several scattered lesions palpable on face, scalp and ears consistent with AKs from chronic sun exposure Previously referred to [facility], now he prefers to return to previous Dermatologist Dr Allyson Sabal in [place]

## 2018-03-03 NOTE — Patient Instructions (Addendum)
Thank you for coming to the office today.  1.  You have some sun damaged skin on face and scalp - these spots can be dry, red scaly and raised sometimes, they look like benign or pre-cancerous skin spots, very rarely do these ever turn into a problem or an actual skin cancer, usually need to be frozen off with cryotherapy by Dermatology  Referral sent to Dr Allyson Sabal in [place] - feel free to call them to check status of apt later this week or next week, we will send referral  2.  For swallowing issue, it sounds like it is a long standing issue, and can be fairly common, usually it has to do with dry mouth and some difficulty with the swallowing muscle mechanism. Try to chew food well, smaller bites, and wash food down with water/liquids  If worsening with actual stuck food, nausea vomiting regurgitation, throat pain, unable to eat or drink or other unusual worse symptoms then let our office know  Next step would be a Swallow Study imaging test at hospital or possibly a referral to specialist for a Scope procedure to look  DUE for FASTING BLOOD WORK (no food or drink after midnight before the lab appointment, only water or coffee without cream/sugar on the morning of)  SCHEDULE "Lab Only" visit in the morning at the clinic for lab draw in 7 MONTHS   - Make sure Lab Only appointment is at about 1 week before your next appointment, so that results will be available  For Lab Results, once available within 2-3 days of blood draw, you can can log in to MyChart online to view your results and a brief explanation. Also, we can discuss results at next follow-up visit.   Please schedule a Follow-up Appointment to: Return in about 7 months (around 10/12/2018) for Annual Physical.    If you have any other questions or concerns, please feel free to call the office or send a message through [place]. You may also schedule an earlier appointment if necessary.  Additionally, you may be receiving a survey  about your experience at our office within a few days to 1 week by e-mail or mail. We value your feedback.  [name], DO [facility]

## 2018-04-08 ENCOUNTER — Ambulatory Visit: Payer: Medicare Other | Admitting: Family Medicine

## 2018-05-13 ENCOUNTER — Telehealth: Payer: Self-pay | Admitting: Family Medicine

## 2018-05-13 DIAGNOSIS — E782 Mixed hyperlipidemia: Secondary | ICD-10-CM

## 2018-05-13 DIAGNOSIS — I10 Essential (primary) hypertension: Secondary | ICD-10-CM

## 2018-05-13 DIAGNOSIS — K2100 Gastro-esophageal reflux disease with esophagitis, without bleeding: Secondary | ICD-10-CM

## 2018-05-13 MED ORDER — LISINOPRIL 20 MG PO TABS: 20.0000 mg | ORAL_TABLET | Freq: Every day | ORAL | 3 refills | Status: DC

## 2018-05-13 MED ORDER — ASPIRIN 81 MG PO TABS: 81.0000 mg | ORAL_TABLET | Freq: Every day | ORAL | 3 refills | Status: AC

## 2018-05-13 MED ORDER — PRAVASTATIN SODIUM 20 MG PO TABS: ORAL_TABLET | ORAL | 3 refills | Status: DC

## 2018-05-13 MED ORDER — OMEPRAZOLE 20 MG PO CPDR: 20.0000 mg | DELAYED_RELEASE_CAPSULE | Freq: Every day | ORAL | 3 refills | Status: DC

## 2018-05-13 NOTE — Telephone Encounter (Signed)
Rx send

## 2018-05-13 NOTE — Telephone Encounter (Signed)
Pt needs new prescriptions for medications sent to [facility].  Please call pt [phone]

## 2018-06-08 ENCOUNTER — Ambulatory Visit: Payer: Medicare Other | Admitting: Family Medicine

## 2018-06-08 VITALS — BP 134/60 | HR 55 | Resp 16 | Ht 67.0 in | Wt 156.0 lb

## 2018-06-08 DIAGNOSIS — I10 Essential (primary) hypertension: Secondary | ICD-10-CM | POA: Diagnosis not present

## 2018-06-08 NOTE — Patient Instructions (Addendum)
Thank you for coming to the office today.  If not improving please return for re-evaluation   Please schedule a Follow-up Appointment to: Return if symptoms worsen or fail to improve.  If you have any other questions or concerns, please feel free to call the office or send a message through [place]. You may also schedule an earlier appointment if necessary.  Additionally, you may be receiving a survey about your experience at our office within a few days to 1 week by e-mail or mail. We value your feedback.  [name], DO [place]

## 2018-06-08 NOTE — Progress Notes (Signed)
Subjective:    Patient ID: Travis Mccoy, male    DOB: 19-Feb-1922, 84 y.o.   MRN: [id number]  Travis Mccoy is a 84 y.o. male presenting on 06/08/2018 for Wheezing  Patient presents for a same day appointment. Patient was scheduled on a walk in basis for nurse visit triage and blood pressure check. I was asked to see patient due to concern of wheezing.  HPI   Audible Wheezing without cough or dyspnea / Elevated BP Patient reports that he has problem with his Left ear hearing aid, hears abnormal sound and his daughter was concerned she could hear him "wheezing" but he does not endorse shortness of breath, cough, or other recent respiratory problems. He was concerned about BP, and it was checked mildly elevated reading, with low DBP and low pulse initially - Today he denies any other active symptoms - he has no significant cardiac or pulmonary symptoms known - Denies fevers, chills, sweats, productive cough, dyspnea, swelling, headache, vision loss, numbness tingling weakness, fall  Depression screen [facility] 2/9 06/08/2018 10/08/2017 08/11/2017  Decreased Interest 0 0 0  Down, Depressed, Hopeless 0 0 0  PHQ - 2 Score 0 0 0    Social History   Tobacco Use  . Smoking status: Former Smoker    Types: Cigarettes    Last attempt to quit: 08/30/1959    Years since quitting: 58.8  . Smokeless tobacco: Never Used  Substance Use Topics  . Alcohol use: Yes    Alcohol/week: 0.6 oz    Types: 1 Cans of beer per week    Comment: beer 3-4 a month   . Drug use: No    Review of Systems Per HPI unless specifically indicated above     Objective:    BP 134/60 (BP Location: Left Arm, Cuff Size: Normal)   Pulse (!) 55   Resp 16   Ht 5\' 7"  (1.702 m)   Wt 156 lb (70.8 kg)   SpO2 99%   BMI 24.43 kg/m   Wt Readings from Last 3 Encounters:  06/08/18 156 lb (70.8 kg)  03/03/18 156 lb (70.8 kg)  10/08/17 155 lb 12.8 oz (70.7 kg)    Physical Exam  Constitutional: He is oriented to person, place,  and time. He appears well-developed and well-nourished. No distress.  Well-appearing, comfortable, cooperative  HENT:  Head: Normocephalic and atraumatic.  Mouth/Throat: Oropharynx is clear and moist.  Hearing aids in place  Eyes: Conjunctivae are normal. Right eye exhibits no discharge. Left eye exhibits no discharge.  Neck: Normal range of motion. Neck supple. No thyromegaly present.  Cardiovascular: Normal rate, regular rhythm, normal heart sounds and intact distal pulses.  No murmur heard. Pulmonary/Chest: Effort normal and breath sounds normal. No respiratory distress. He has no wheezes. He has no rales.  Good air movement, speaks full sentences  Musculoskeletal: Normal range of motion. He exhibits no edema.  Lymphadenopathy:    He has no cervical adenopathy.  Neurological: He is alert and oriented to person, place, and time.  Skin: Skin is warm and dry. No rash noted. He is not diaphoretic. No erythema.  Psychiatric: He has a normal mood and affect. His behavior is normal.  Well groomed, good eye contact, normal speech and thoughts  Nursing note and vitals reviewed.      Assessment & Plan:   Problem List Items Addressed This Visit    Hypertension - Primary      Reassuring BP check today, seems SBP improved on  re-check, pulse was initially low suspect automatic reading was inaccurate, manual reading was more appropriate in mid 50s range. - No change to medications for HTN - Reassurance  Not clinically consistent with wheezing or dyspnea Exam is benign with clear lungs and good air movement - Reassurance, no further treatment or testing for breathing, suspect audible wheezing heard by family may be upper airway among other causes  Also consider possible problem with L hearing aid due to patient report  No orders of the defined types were placed in this encounter.   Follow up plan: Return if symptoms worsen or fail to improve.  [name], [facility] 06/08/2018, 1:24 PM

## 2018-08-11 ENCOUNTER — Telehealth: Payer: Self-pay | Admitting: Family Medicine

## 2018-08-11 NOTE — Telephone Encounter (Signed)
Called to schedule Medicare Annual Wellness Visit with the Nurse Health Advisor.   If patient returns call, please note: their last AWV was on 8/ 14/18 please schedule AWV with [facility] any date after Aug 11 2018  Thank you! For any questions please contact: Jill Alexanders [phone] or Skype at: [email or web address]

## 2018-10-13 ENCOUNTER — Other Ambulatory Visit: Payer: Medicare Other

## 2018-10-13 DIAGNOSIS — I10 Essential (primary) hypertension: Secondary | ICD-10-CM

## 2018-10-13 DIAGNOSIS — Z0000 Encounter for general adult medical examination without abnormal findings: Secondary | ICD-10-CM | POA: Diagnosis not present

## 2018-10-13 DIAGNOSIS — R7303 Prediabetes: Secondary | ICD-10-CM | POA: Diagnosis not present

## 2018-10-13 DIAGNOSIS — E782 Mixed hyperlipidemia: Secondary | ICD-10-CM | POA: Diagnosis not present

## 2018-10-14 LAB — CBC WITH DIFFERENTIAL/PLATELET
BASOS ABS: 90 {cells}/uL (ref 0–200)
Basophils Relative: 1.4 %
EOS ABS: 179 {cells}/uL (ref 15–500)
Eosinophils Relative: 2.8 %
HEMATOCRIT: 39.6 % (ref 38.5–50.0)
Hemoglobin: 13.6 g/dL (ref 13.2–17.1)
LYMPHS ABS: 1760 {cells}/uL (ref 850–3900)
MCH: 32 pg (ref 27.0–33.0)
MCHC: 34.3 g/dL (ref 32.0–36.0)
MCV: 93.2 fL (ref 80.0–100.0)
MPV: 10.9 fL (ref 7.5–12.5)
Monocytes Relative: 10.6 %
NEUTROS PCT: 57.7 %
Neutro Abs: 3693 cells/uL (ref 1500–7800)
PLATELETS: 205 10*3/uL (ref 140–400)
RBC: 4.25 10*6/uL (ref 4.20–5.80)
RDW: 12.9 % (ref 11.0–15.0)
TOTAL LYMPHOCYTE: 27.5 %
WBC: 6.4 10*3/uL (ref 3.8–10.8)
WBCMIX: 678 {cells}/uL (ref 200–950)

## 2018-10-14 LAB — COMPLETE METABOLIC PANEL WITH GFR
AG Ratio: 1.7 (calc) (ref 1.0–2.5)
ALBUMIN MSPROF: 3.9 g/dL (ref 3.6–5.1)
ALKALINE PHOSPHATASE (APISO): 81 U/L (ref 40–115)
ALT: 12 U/L (ref 9–46)
AST: 20 U/L (ref 10–35)
BUN: 24 mg/dL (ref 7–25)
CO2: 29 mmol/L (ref 20–32)
CREATININE: 1.06 mg/dL (ref 0.70–1.11)
Calcium: 9.4 mg/dL (ref 8.6–10.3)
Chloride: 104 mmol/L (ref 98–110)
GFR, EST AFRICAN AMERICAN: 69 mL/min/{1.73_m2} (ref >=60)
GFR, Est Non African American: 60 mL/min/{1.73_m2} (ref >=60)
Globulin: 2.3 g/dL (calc) (ref 1.9–3.7)
Glucose, Bld: 109 mg/dL — ABNORMAL HIGH (ref 65–99)
Potassium: 4.8 mmol/L (ref 3.5–5.3)
Sodium: 138 mmol/L (ref 135–146)
TOTAL PROTEIN: 6.2 g/dL (ref 6.1–8.1)
Total Bilirubin: 0.7 mg/dL (ref 0.2–1.2)

## 2018-10-14 LAB — HEMOGLOBIN A1C
Hgb A1c MFr Bld: 6.4 % of total Hgb — ABNORMAL HIGH (ref <5.7)
MEAN PLASMA GLUCOSE: 137 (calc)
eAG (mmol/L): 7.6 (calc)

## 2018-10-14 LAB — LIPID PANEL
CHOLESTEROL: 164 mg/dL (ref <200)
HDL: 49 mg/dL (ref >40)
LDL CHOLESTEROL (CALC): 95 mg/dL
Non-HDL Cholesterol (Calc): 115 mg/dL (calc) (ref <130)
TRIGLYCERIDES: 108 mg/dL (ref <150)
Total CHOL/HDL Ratio: 3.3 (calc) (ref <5.0)

## 2018-10-18 DIAGNOSIS — L814 Other melanin hyperpigmentation: Secondary | ICD-10-CM | POA: Diagnosis not present

## 2018-10-18 DIAGNOSIS — C44212 Basal cell carcinoma of skin of right ear and external auricular canal: Secondary | ICD-10-CM | POA: Diagnosis not present

## 2018-10-18 DIAGNOSIS — L988 Other specified disorders of the skin and subcutaneous tissue: Secondary | ICD-10-CM | POA: Diagnosis not present

## 2018-10-18 DIAGNOSIS — L578 Other skin changes due to chronic exposure to nonionizing radiation: Secondary | ICD-10-CM | POA: Diagnosis not present

## 2018-10-18 DIAGNOSIS — C4491 Basal cell carcinoma of skin, unspecified: Secondary | ICD-10-CM | POA: Diagnosis not present

## 2018-10-18 HISTORY — PX: SKIN CANCER EXCISION: SHX779

## 2018-10-19 ENCOUNTER — Encounter: Payer: Self-pay | Admitting: Family Medicine

## 2018-10-19 ENCOUNTER — Ambulatory Visit: Payer: Medicare Other

## 2018-10-19 ENCOUNTER — Ambulatory Visit (INDEPENDENT_AMBULATORY_CARE_PROVIDER_SITE_OTHER): Payer: Medicare Other | Admitting: Family Medicine

## 2018-10-19 VITALS — BP 126/60 | HR 72 | Temp 98.4°F | Resp 16 | Ht 67.0 in | Wt 152.0 lb

## 2018-10-19 DIAGNOSIS — Z0000 Encounter for general adult medical examination without abnormal findings: Secondary | ICD-10-CM

## 2018-10-19 DIAGNOSIS — R7303 Prediabetes: Secondary | ICD-10-CM

## 2018-10-19 DIAGNOSIS — I10 Essential (primary) hypertension: Secondary | ICD-10-CM | POA: Diagnosis not present

## 2018-10-19 DIAGNOSIS — E782 Mixed hyperlipidemia: Secondary | ICD-10-CM

## 2018-10-19 MED ORDER — PRAVASTATIN SODIUM 20 MG PO TABS: ORAL_TABLET | ORAL | 3 refills | Status: DC

## 2018-10-19 NOTE — Patient Instructions (Addendum)
Thank you for coming to the office today.  1. Chemistry - Normal results, including electrolytes, kidney and liver function. Slightly elevated fasting blood sugar due to pre-diabetes.   2. Hemoglobin A1c (Pre-Diabetes) - 6.4, relatively stable from 6.2 to 6.4, in range of Pre-Diabetes (>5.7 to 6.4)   3. Cholesterol - Normal cholesterol results including HDL (good cholesterol), LDL (bad cholesterol), and Triglycerides. Controlled on Pravastatin 20mg  intermittent, every other day  4. CBC Blood Counts - Normal, no anemia, other abnormality  Leg cramps - Try spoonful of yellow mustard to relieve leg cramps or try daily to prevent the problem  - OTC natural option is Hyland's Leg Cramps (Dissolving tablet) take as needed for muscle cramps  Please schedule a Follow-up Appointment to: Return in about 6 months (around 04/20/2019) for 6 months HTN.  If you have any other questions or concerns, please feel free to call the office or send a message through [place]. You may also schedule an earlier appointment if necessary.  Additionally, you may be receiving a survey about your experience at our office within a few days to 1 week by e-mail or mail. We value your feedback.  [name], DO [place]

## 2018-10-19 NOTE — Patient Instructions (Signed)
[name] , Thank you for taking time to come for your Medicare Wellness Visit. I appreciate your ongoing commitment to your health goals. Please review the following plan we discussed and let me know if I can assist you in the future.   Screening recommendations/referrals: Colonoscopy: no longer required Recommended yearly ophthalmology/optometry visit for glaucoma screening and checkup Recommended yearly dental visit for hygiene and checkup  Vaccinations: Influenza vaccine: up to date Pneumococcal vaccine: completed series Tdap vaccine: up to date Shingles vaccine: shingrix eligible, check with your insurance company for coverage     Advanced directives: Please bring a copy of your health care power of attorney and living will to the office at your convenience.  Conditions/risks identified: Recommend drinking at least 5-6 glasses of water a day  Next appointment: Follow up in one year for your annual wellness exam.   Preventive Care 65 Years and Older, Male Preventive care refers to lifestyle choices and visits with your health care provider that can promote health and wellness. What does preventive care include?  A yearly physical exam. This is also called an annual well check.  Dental exams once or twice a year.  Routine eye exams. Ask your health care provider how often you should have your eyes checked.  Personal lifestyle choices, including:  Daily care of your teeth and gums.  Regular physical activity.  Eating a healthy diet.  Avoiding tobacco and drug use.  Limiting alcohol use.  Practicing safe sex.  Taking low doses of aspirin every day.  Taking vitamin and mineral supplements as recommended by your health care provider. What happens during an annual well check? The services and screenings done by your health care provider during your annual well check will depend on your age, overall health, lifestyle risk factors, and family history of  disease. Counseling  Your health care provider may ask you questions about your:  Alcohol use.  Tobacco use.  Drug use.  Emotional well-being.  Home and relationship well-being.  Sexual activity.  Eating habits.  History of falls.  Memory and ability to understand (cognition).  Work and work Statistician. Screening  You may have the following tests or measurements:  Height, weight, and BMI.  Blood pressure.  Lipid and cholesterol levels. These may be checked every 5 years, or more frequently if you are over 82 years old.  Skin check.  Lung cancer screening. You may have this screening every year starting at age 63 if you have a 30-pack-year history of smoking and currently smoke or have quit within the past 15 years.  Fecal occult blood test (FOBT) of the stool. You may have this test every year starting at age 84.  Flexible sigmoidoscopy or colonoscopy. You may have a sigmoidoscopy every 5 years or a colonoscopy every 10 years starting at age 39.  Prostate cancer screening. Recommendations will vary depending on your family history and other risks.  Hepatitis C blood test.  Hepatitis B blood test.  Sexually transmitted disease (STD) testing.  Diabetes screening. This is done by checking your blood sugar (glucose) after you have not eaten for a while (fasting). You may have this done every 1-3 years.  Abdominal aortic aneurysm (AAA) screening. You may need this if you are a current or former smoker.  Osteoporosis. You may be screened starting at age 35 if you are at high risk. Talk with your health care provider about your test results, treatment options, and if necessary, the need for more tests. Vaccines  Your health care provider may recommend certain vaccines, such as:  Influenza vaccine. This is recommended every year.  Tetanus, diphtheria, and acellular pertussis (Tdap, Td) vaccine. You may need a Td booster every 10 years.  Zoster vaccine. You may  need this after age 54.  Pneumococcal 13-valent conjugate (PCV13) vaccine. One dose is recommended after age 45.  Pneumococcal polysaccharide (PPSV23) vaccine. One dose is recommended after age 101. Talk to your health care provider about which screenings and vaccines you need and how often you need them. This information is not intended to replace advice given to you by your health care provider. Make sure you discuss any questions you have with your health care provider. Document Released: 01/11/2016 Document Revised: 09/03/2016 Document Reviewed: 10/16/2015 Elsevier Interactive Patient Education  2017 [place] Prevention in the Home Falls can cause injuries. They can happen to people of all ages. There are many things you can do to make your home safe and to help prevent falls. What can I do on the outside of my home?  Regularly fix the edges of walkways and driveways and fix any cracks.  Remove anything that might make you trip as you walk through a door, such as a raised step or threshold.  Trim any bushes or trees on the path to your home.  Use bright outdoor lighting.  Clear any walking paths of anything that might make someone trip, such as rocks or tools.  Regularly check to see if handrails are loose or broken. Make sure that both sides of any steps have handrails.  Any raised decks and porches should have guardrails on the edges.  Have any leaves, snow, or ice cleared regularly.  Use sand or salt on walking paths during winter.  Clean up any spills in your garage right away. This includes oil or grease spills. What can I do in the bathroom?  Use night lights.  Install grab bars by the toilet and in the tub and shower. Do not use towel bars as grab bars.  Use non-skid mats or decals in the tub or shower.  If you need to sit down in the shower, use a plastic, non-slip stool.  Keep the floor dry. Clean up any water that spills on the floor as soon as it  happens.  Remove soap buildup in the tub or shower regularly.  Attach bath mats securely with double-sided non-slip rug tape.  Do not have throw rugs and other things on the floor that can make you trip. What can I do in the bedroom?  Use night lights.  Make sure that you have a light by your bed that is easy to reach.  Do not use any sheets or blankets that are too big for your bed. They should not hang down onto the floor.  Have a firm chair that has side arms. You can use this for support while you get dressed.  Do not have throw rugs and other things on the floor that can make you trip. What can I do in the kitchen?  Clean up any spills right away.  Avoid walking on wet floors.  Keep items that you use a lot in easy-to-reach places.  If you need to reach something above you, use a strong step stool that has a grab bar.  Keep electrical cords out of the way.  Do not use floor polish or wax that makes floors slippery. If you must use wax, use non-skid floor wax.  Do  not have throw rugs and other things on the floor that can make you trip. What can I do with my stairs?  Do not leave any items on the stairs.  Make sure that there are handrails on both sides of the stairs and use them. Fix handrails that are broken or loose. Make sure that handrails are as long as the stairways.  Check any carpeting to make sure that it is firmly attached to the stairs. Fix any carpet that is loose or worn.  Avoid having throw rugs at the top or bottom of the stairs. If you do have throw rugs, attach them to the floor with carpet tape.  Make sure that you have a light switch at the top of the stairs and the bottom of the stairs. If you do not have them, ask someone to add them for you. What else can I do to help prevent falls?  Wear shoes that:  Do not have high heels.  Have rubber bottoms.  Are comfortable and fit you well.  Are closed at the toe. Do not wear sandals.  If you  use a stepladder:  Make sure that it is fully opened. Do not climb a closed stepladder.  Make sure that both sides of the stepladder are locked into place.  Ask someone to hold it for you, if possible.  Clearly mark and make sure that you can see:  Any grab bars or handrails.  First and last steps.  Where the edge of each step is.  Use tools that help you move around (mobility aids) if they are needed. These include:  Canes.  Walkers.  Scooters.  Crutches.  Turn on the lights when you go into a dark area. Replace any light bulbs as soon as they burn out.  Set up your furniture so you have a clear path. Avoid moving your furniture around.  If any of your floors are uneven, fix them.  If there are any pets around you, be aware of where they are.  Review your medicines with your doctor. Some medicines can make you feel dizzy. This can increase your chance of falling. Ask your doctor what other things that you can do to help prevent falls. This information is not intended to replace advice given to you by your health care provider. Make sure you discuss any questions you have with your health care provider. Document Released: 10/11/2009 Document Revised: 05/22/2016 Document Reviewed: 01/19/2015 Elsevier Interactive Patient Education  2017 [employer].

## 2018-10-19 NOTE — Progress Notes (Signed)
Subjective:   Travis Mccoy is a 84 y.o. male who presents for Medicare Annual/Subsequent preventive examination.  Review of Systems:   Cardiac Risk Factors include: advanced age (>66men, >22 women);hypertension;dyslipidemia;male gender     Objective:    Vitals: BP 126/60 (BP Location: Left Arm, Patient Position: Sitting, Cuff Size: Normal)   Pulse 72   Temp 98.4 F (36.9 C) (Oral)   Resp 16   Ht 5\' 7"  (1.702 m)   Wt 152 lb (68.9 kg)   BMI 23.81 kg/m   Body mass index is 23.81 kg/m.  Advanced Directives 10/19/2018 08/11/2017 01/29/2016  Does Patient Have a Medical Advance Directive? Yes No Yes  Type of Advance Directive Living will;Healthcare Power of [place];Living will  Does patient want to make changes to medical advance directive? - Yes (MAU/Ambulatory/Procedural Areas - Information given) -  Copy of [place] in Chart? No - copy requested - No - copy requested    Tobacco Social History   Tobacco Use  Smoking Status Former Smoker  . Types: Cigarettes  . Last attempt to quit: 08/30/1959  . Years since quitting: 59.1  Smokeless Tobacco Former Engineer, structural given: Not Answered   Clinical Intake:  Pre-visit preparation completed: Yes  Pain : No/denies pain     Nutritional Status: BMI of 19-24  Normal Nutritional Risks: None Diabetes: No  How often do you need to have someone help you when you read instructions, pamphlets, or other written materials from your doctor or pharmacy?: 1 - Never What is the last grade level you completed in school?: 12th grade  Interpreter Needed?: No  Information entered by :: Delena Casebeer,LPN   Past Medical History:  Diagnosis Date  . GERD (gastroesophageal reflux disease)   . Hyperlipidemia   . Hypertension    Past Surgical History:  Procedure Laterality Date  . HERNIA REPAIR  2009  . SKIN CANCER EXCISION Right 10/18/2018   ear   Family History  Problem  Relation Age of Onset  . Stroke Father    Social History   Socioeconomic History  . Marital status: Widowed    Spouse name: Not on file  . Number of children: Not on file  . Years of education: Not on file  . Highest education level: 12th grade  Occupational History  . Not on file  Social Needs  . Financial resource strain: Not hard at all  . Food insecurity:    Worry: Never true    Inability: Never true  . Transportation needs:    Medical: No    Non-medical: No  Tobacco Use  . Smoking status: Former Smoker    Types: Cigarettes    Last attempt to quit: 08/30/1959    Years since quitting: 59.1  . Smokeless tobacco: Former Network engineer and Sexual Activity  . Alcohol use: Yes    Alcohol/week: 1.0 standard drinks    Types: 1 Cans of beer per week    Comment: beer 3-4 a month   . Drug use: No  . Sexual activity: Not on file  Lifestyle  . Physical activity:    Days per week: 0 days    Minutes per session: 0 min  . Stress: Not at all  Relationships  . Social connections:    Talks on phone: More than three times a week    Gets together: More than three times a week    Attends religious service: Never  Active member of club or organization: No    Attends meetings of clubs or organizations: Never    Relationship status: Widowed  Other Topics Concern  . Not on file  Social History Narrative  . Not on file    Outpatient Encounter Medications as of 10/19/2018  Medication Sig  . aspirin 81 MG tablet Take 1 tablet (81 mg total) by mouth daily.  . fluticasone (FLONASE) 50 MCG/ACT nasal spray Place 2 sprays into both nostrils daily.  Marland Kitchen lisinopril (PRINIVIL,ZESTRIL) 20 MG tablet Take 1 tablet (20 mg total) by mouth daily.  . Multiple Vitamins-Minerals (PRESERVISION AREDS 2+MULTI VIT PO) Take by mouth.  . neomycin-polymyxin b-dexamethasone (MAXITROL) 3.5-10000-0.1 SUSP INSTILL 1 DROP INTO RIGHT EYE THREE TIMES DAILY FOR 10 DAYS  . omeprazole (PRILOSEC) 20 MG capsule Take 1  capsule (20 mg total) by mouth daily.  Vladimir Faster Glycol-Propyl Glycol 0.4-0.3 % SOLN Apply to eye.  . pravastatin (PRAVACHOL) 20 MG tablet Take 1 tablet by mouth every other evening   No facility-administered encounter medications on file as of 10/19/2018.     Activities of Daily Living In your present state of health, do you have any difficulty performing the following activities: 10/19/2018 10/19/2018  Hearing? Y Y  Comment hearing aids  -  Vision? N Y  Difficulty concentrating or making decisions? N N  Walking or climbing stairs? N N  Dressing or bathing? N N  Doing errands, shopping? Y N  Comment has transportation assistance  -  Conservation officer, nature and eating ? Y -  Comment someone assists with bigger meals -  Using the Toilet? N -  In the past six months, have you accidently leaked urine? N -  Do you have problems with loss of bowel control? N -  Managing your Medications? Y -  Comment someone assists  -  Managing your Finances? N -  Housekeeping or managing your Housekeeping? Y -  Comment someone assists  -  Some recent data might be hidden    Patient Care Team: Olin Hauser, DO as PCP - General (Family Medicine)   Assessment:   This is a routine wellness examination for Travis Mccoy.  Exercise Activities and Dietary recommendations Current Exercise Habits: The patient does not participate in regular exercise at present;Home exercise routine, Type of exercise: strength training/weights, Time (Minutes): 10, Frequency (Times/Week): 3, Weekly Exercise (Minutes/Week): 30, Intensity: Mild, Exercise limited by: None identified  Goals    . cleaning     Would like to clean rifles and put away for winter.     . Increase water intake     Recommend drinking at least 5-6 glasses of water a day       Fall Risk Fall Risk  10/19/2018 06/08/2018 10/08/2017 08/11/2017 09/15/2016  Falls in the past year? No No No No No  Number falls in past yr: - - - - -  Injury with Fall? - -  - - -  Risk for fall due to : - - - - -   [place]:  Any stairs in or around the home WITH handrails? Yes  Home free of loose throw rugs in walkways, pet beds, electrical cords, etc? Yes  Adequate lighting in your home to reduce risk of falls? Yes   ASSISTIVE DEVICES UTILIZED TO PREVENT FALLS:  Life alert? No  Use of a cane, walker or w/c? Yes  Grab bars in the bathroom? No  Shower chair or bench in shower?  No  Elevated toilet seat or a handicapped toilet? No   DME ORDERS:  DME order needed?  No   TIMED UP AND GO:  Was the test performed? Yes .  Length of time to ambulate 10 feet: 10 sec.   GAIT:  Appearance of gait: Gait stead-fast with the use of an assistive device.  Education: Fall risk prevention has been discussed.  Intervention(s) required? No    Depression Screen PHQ 2/9 Scores 10/19/2018 06/08/2018 10/08/2017 08/11/2017  PHQ - 2 Score 0 0 0 0    Cognitive Function MMSE - Mini Mental State Exam 01/29/2016  Orientation to time 5  Orientation to Place 5  Registration 3  Attention/ Calculation 5  Recall 0  Language- name 2 objects 2  Language- repeat 1  Language- follow 3 step command 3  Language- read & follow direction 1  Write a sentence 1  Copy design 1  Total score 27     6CIT Screen 10/19/2018 08/11/2017  What Year? 0 points 0 points  What month? 0 points 0 points  What time? 0 points 0 points  Count back from 20 0 points 0 points  Months in reverse 0 points 0 points  Repeat phrase 2 points 2 points  Total Score 2 2    Immunization History  Administered Date(s) Administered  . Influenza Whole 09/26/2010  . Influenza, High Dose Seasonal PF 10/03/2015, 09/15/2016, 10/08/2017, 09/21/2018  . Influenza-Unspecified 10/10/2014  . Pneumococcal Conjugate-13 09/27/2014  . Pneumococcal Polysaccharide-23 12/30/2007  . Tdap 01/05/2015    Qualifies for Shingles Vaccine? Yes  Due for Shingrix. Education has been  provided regarding the importance of this vaccine. Pt has been advised to call insurance company to determine out of pocket expense. Advised may also receive vaccine at local pharmacy or Health Dept. Verbalized acceptance and understanding.  Tdap: up to date   Flu Vaccine: completed   Pneumococcal Vaccine: completed series  Screening Tests Health Maintenance  Topic Date Due  . INFLUENZA VACCINE  07/29/2018  . TETANUS/TDAP  01/05/2025  . PNA vac Low Risk Adult  Completed   Cancer Screenings:  Colorectal Screening: no longer required    Lung Cancer Screening: (Low Dose CT Chest recommended if Age 64-80 years, 30 pack-year currently smoking OR have quit w/in 15years.) does not qualify.    Additional Screening:  Hepatitis C Screening: does not qualify  Vision Screening: Recommended annual ophthalmology exams for early detection of glaucoma and other disorders of the eye. Is the patient up to date with their annual eye exam?  Yes  Who is the provider or what is the name of the office in which the pt attends annual eye exams? [facility]    Dental Screening: Recommended annual dental exams for proper oral hygiene  Community Resource Referral:  CRR required this visit?  No       Plan:    I have personally reviewed and addressed the Medicare Annual Wellness questionnaire and have noted the following in the patient's chart:  A. Medical and social history B. Use of alcohol, tobacco or illicit drugs  C. Current medications and supplements D. Functional ability and status E.  Nutritional status F.  Physical activity G. Advance directives H. List of other physicians I.  Hospitalizations, surgeries, and ER visits in previous 12 months J.  [place] such as hearing and vision if needed, cognitive and depression L. Referrals and appointments   In addition, I have reviewed and discussed with patient certain  preventive protocols, quality metrics, and best  practice recommendations. A written personalized care plan for preventive services as well as general preventive health recommendations were provided to patient.   Signed,  Tyler Aas, LPN Nurse Health Advisor   Nurse Notes:none

## 2018-10-19 NOTE — Progress Notes (Signed)
Subjective:    Patient ID: Travis Mccoy, male    DOB: July 01, 1922, 84 y.o.   MRN: [id number]  Travis Mccoy is a 84 y.o. male presenting on 10/19/2018 for Annual Exam   HPI   Here for Annual Physical and Lab Review. Patient was also seen by Tyler Aas LPN today for AMW.  CHRONIC HTN: No new concerns. Home BP normal. Current Meds - Lisinopril 20mg  Reports good compliance, did take med today. Tolerating well, w/o complaints. Lifestyle: - Diet: Balanced, no significant change since last visit - Exercise: Stays active around house and garden  HYPERLIPIDEMIA: Last lipid 09/2018, controlled - Currently taking Pravastatin 20mg  one pill every 3 days, tolerating well without side effects or myalgias - Taking Aspirin 81mg  daily - Admits mild occasional leg cramp, not associated to statin use  Pre-DM Doing well, no new concerns. Last sugar A1c 6.4 stable from previous Not checking CBG Meds: None Currently on ACEi   BCC R Ear Helix  Followed by [facility] Dr Manley Mason, recent procedure, still following. No new complaints.  Muscle cramps Admits occasional lower leg muscle cramps, brief episodes, worse at night sometimes. Eats K rich diet. Asking for suggestions. No other symptoms, no edema redness or other pain.  Health Maintenance: UTD Flu vaccine UTD PNA Vaccine  Depression screen [facility] 2/9 10/19/2018 06/08/2018 10/08/2017  Decreased Interest 0 0 0  Down, Depressed, Hopeless 0 0 0  PHQ - 2 Score 0 0 0    Past Medical History:  Diagnosis Date  . GERD (gastroesophageal reflux disease)   . Hyperlipidemia   . Hypertension    Past Surgical History:  Procedure Laterality Date  . HERNIA REPAIR  2009  . SKIN CANCER EXCISION Right 10/18/2018   ear   Social History   Socioeconomic History  . Marital status: Widowed    Spouse name: Not on file  . Number of children: Not on file  . Years of education: Not on file  . Highest education level: 12th grade  Occupational  History  . Not on file  Social Needs  . Financial resource strain: Not hard at all  . Food insecurity:    Worry: Never true    Inability: Never true  . Transportation needs:    Medical: No    Non-medical: No  Tobacco Use  . Smoking status: Former Smoker    Types: Cigarettes    Last attempt to quit: 08/30/1959    Years since quitting: 59.1  . Smokeless tobacco: Former Network engineer and Sexual Activity  . Alcohol use: Yes    Alcohol/week: 1.0 standard drinks    Types: 1 Cans of beer per week    Comment: beer 3-4 a month   . Drug use: No  . Sexual activity: Not on file  Lifestyle  . Physical activity:    Days per week: 0 days    Minutes per session: 0 min  . Stress: Not at all  Relationships  . Social connections:    Talks on phone: More than three times a week    Gets together: More than three times a week    Attends religious service: Never    Active member of club or organization: No    Attends meetings of clubs or organizations: Never    Relationship status: Widowed  . Intimate partner violence:    Fear of current or ex partner: No    Emotionally abused: No    Physically abused: No  Forced sexual activity: No  Other Topics Concern  . Not on file  Social History Narrative  . Not on file   Family History  Problem Relation Age of Onset  . Stroke Father    Current Outpatient Medications on File Prior to Visit  Medication Sig  . aspirin 81 MG tablet Take 1 tablet (81 mg total) by mouth daily.  . fluticasone (FLONASE) 50 MCG/ACT nasal spray Place 2 sprays into both nostrils daily.  Marland Kitchen lisinopril (PRINIVIL,ZESTRIL) 20 MG tablet Take 1 tablet (20 mg total) by mouth daily.  Marland Kitchen omeprazole (PRILOSEC) 20 MG capsule Take 1 capsule (20 mg total) by mouth daily.  Vladimir Faster Glycol-Propyl Glycol 0.4-0.3 % SOLN Apply to eye.   No current facility-administered medications on file prior to visit.     Review of Systems  Constitutional: Negative for activity change,  appetite change, chills, diaphoresis, fatigue and fever.  HENT: Negative for congestion and hearing loss.   Eyes: Negative for visual disturbance.  Respiratory: Negative for cough, chest tightness, shortness of breath and wheezing.   Cardiovascular: Negative for chest pain, palpitations and leg swelling.  Gastrointestinal: Negative for abdominal pain, constipation, diarrhea, nausea and vomiting.  Endocrine: Negative for cold intolerance.  Genitourinary: Negative for dysuria, frequency and hematuria.  Musculoskeletal: Negative for arthralgias and neck pain.  Skin: Negative for rash.  Allergic/Immunologic: Negative for environmental allergies.  Neurological: Negative for dizziness, weakness, light-headedness, numbness and headaches.  Hematological: Negative for adenopathy.  Psychiatric/Behavioral: Negative for behavioral problems, dysphoric mood and sleep disturbance.   Per HPI unless specifically indicated above    Objective:    BP 126/60   Pulse 72   Temp 98.4 F (36.9 C) (Oral)   Resp 16   Ht 5\' 7"  (1.702 m)   Wt 152 lb (68.9 kg)   BMI 23.81 kg/m   Wt Readings from Last 3 Encounters:  10/19/18 152 lb (68.9 kg)  10/19/18 152 lb (68.9 kg)  06/08/18 156 lb (70.8 kg)    Physical Exam  Constitutional: He is oriented to person, place, and time. He appears well-developed and well-nourished. No distress.  Well-appearing elderly 84 year old male, comfortable, cooperative  HENT:  Head: Normocephalic and atraumatic.  Mouth/Throat: Oropharynx is clear and moist.  Frontal / maxillary sinuses non-tender. Nares patent without purulence or edema. Oropharynx clear without erythema, exudates, edema or asymmetry.  Eyes: Pupils are equal, round, and reactive to light. Conjunctivae and EOM are normal. Right eye exhibits no discharge. Left eye exhibits no discharge.  Neck: Normal range of motion. Neck supple. No thyromegaly present.  Cardiovascular: Normal rate, regular rhythm, normal heart  sounds and intact distal pulses.  No murmur heard. Pulmonary/Chest: Effort normal and breath sounds normal. No respiratory distress. He has no wheezes. He has no rales.  Abdominal: Soft. Bowel sounds are normal. He exhibits no distension and no mass. There is no tenderness.  Musculoskeletal: Normal range of motion. He exhibits no edema or tenderness.  Upper / Lower Extremities: - Normal muscle tone, strength bilateral upper extremities 5/5, lower extremities 5/5  Lymphadenopathy:    He has no cervical adenopathy.  Neurological: He is alert and oriented to person, place, and time.  Distal sensation intact to light touch all extremities  Skin: Skin is warm and dry. No rash noted. He is not diaphoretic. No erythema.  Psychiatric: He has a normal mood and affect. His behavior is normal.  Well groomed, good eye contact, normal speech and thoughts  Nursing note and vitals  reviewed.  Results for orders placed or performed in visit on 10/13/18  Lipid panel  Result Value Ref Range   Cholesterol 164 <200 mg/dL   HDL 49 >40 mg/dL   Triglycerides 108 <150 mg/dL   LDL Cholesterol (Calc) 95 mg/dL (calc)   Total CHOL/HDL Ratio 3.3 <5.0 (calc)   Non-HDL Cholesterol (Calc) 115 <130 mg/dL (calc)  COMPLETE METABOLIC PANEL WITH GFR  Result Value Ref Range   Glucose, Bld 109 (H) 65 - 99 mg/dL   BUN 24 7 - 25 mg/dL   Creat 1.06 0.70 - 1.11 mg/dL   GFR, Est Non African American 60 > OR = 60 mL/min/1.57m2   GFR, Est African American 69 > OR = 60 mL/min/1.46m2   BUN/Creatinine Ratio NOT APPLICABLE 6 - 22 (calc)   Sodium 138 135 - 146 mmol/L   Potassium 4.8 3.5 - 5.3 mmol/L   Chloride 104 98 - 110 mmol/L   CO2 29 20 - 32 mmol/L   Calcium 9.4 8.6 - 10.3 mg/dL   Total Protein 6.2 6.1 - 8.1 g/dL   Albumin 3.9 3.6 - 5.1 g/dL   Globulin 2.3 1.9 - 3.7 g/dL (calc)   AG Ratio 1.7 1.0 - 2.5 (calc)   Total Bilirubin 0.7 0.2 - 1.2 mg/dL   Alkaline phosphatase (APISO) 81 40 - 115 U/L   AST 20 10 - 35 U/L    ALT 12 9 - 46 U/L  CBC with Differential/Platelet  Result Value Ref Range   WBC 6.4 3.8 - 10.8 Thousand/uL   RBC 4.25 4.20 - 5.80 Million/uL   Hemoglobin 13.6 13.2 - 17.1 g/dL   HCT 39.6 38.5 - 50.0 %   MCV 93.2 80.0 - 100.0 fL   MCH 32.0 27.0 - 33.0 pg   MCHC 34.3 32.0 - 36.0 g/dL   RDW 12.9 11.0 - 15.0 %   Platelets 205 140 - 400 Thousand/uL   MPV 10.9 7.5 - 12.5 fL   Neutro Abs 3,693 1,500 - 7,800 cells/uL   Lymphs Abs 1,760 850 - 3,900 cells/uL   WBC mixed population 678 200 - 950 cells/uL   Eosinophils Absolute 179 15 - 500 cells/uL   Basophils Absolute 90 0 - 200 cells/uL   Neutrophils Relative % 57.7 %   Total Lymphocyte 27.5 %   Monocytes Relative 10.6 %   Eosinophils Relative 2.8 %   Basophils Relative 1.4 %  Hemoglobin A1c  Result Value Ref Range   Hgb A1c MFr Bld 6.4 (H) <5.7 % of total Hgb   Mean Plasma Glucose 137 (calc)   eAG (mmol/L) 7.6 (calc)      Assessment & Plan:   Problem List Items Addressed This Visit    Hyperlipidemia    Controlled, last lipid 09/2018  Plan: 1. Continue current Pravastatin 20mg  - changed rx to QOD every other day dosing 2. Continue ASA 81mg  daily - without bleeding or side effect, agree to continue 3. Encouraged improve lifestyle - exercise - yearly lipid      Relevant Medications   pravastatin (PRAVACHOL) 20 MG tablet   Hypertension    Well-controlled HTN - Home BP readings none available  No known complications  Plan:  1. Continue current BP regimen - Lisinopril 20mg  daily 2. Encourage improved lifestyle - low sodium diet, regular exercise 3. Start to monitor BP more frequently at home 4. Follow-up q 6 mo      Relevant Medications   pravastatin (PRAVACHOL) 20 MG tablet   Pre-diabetes  Stable but mild elevated A1c for Pre-DM 6.2 to 6.4 over past 2 years   Plan:  1. Not on any therapy currently  2. Encourage improved lifestyle - remain active, healthy diet choices Yearly A1c, will defer for 6 months         Other Visit Diagnoses    Annual physical exam    -  Primary      Updated Health Maintenance information Reviewed recent lab results with patient Encouraged improvement to lifestyle with diet and exercise  Meds ordered this encounter  Medications  . pravastatin (PRAVACHOL) 20 MG tablet    Sig: Take 1 tablet by mouth every other evening    Dispense:  45 tablet    Refill:  3    Change quantity for every other day dosing     Follow up plan: Return in about 6 months (around 04/20/2019) for 6 months HTN.  [name], DO [facility] 10/19/2018, 7:40 PM

## 2018-10-20 ENCOUNTER — Encounter: Payer: Medicare Other | Admitting: Family Medicine

## 2018-10-20 NOTE — Assessment & Plan Note (Signed)
Stable but mild elevated A1c for Pre-DM 6.2 to 6.4 over past 2 years   Plan:  1. Not on any therapy currently  2. Encourage improved lifestyle - remain active, healthy diet choices Yearly A1c, will defer for 6 months

## 2018-10-20 NOTE — Assessment & Plan Note (Addendum)
Controlled, last lipid 09/2018  Plan: 1. Continue current Pravastatin 20mg  - changed rx to QOD every other day dosing 2. Continue ASA 81mg  daily - without bleeding or side effect, agree to continue 3. Encouraged improve lifestyle - exercise - yearly lipid

## 2018-10-20 NOTE — Assessment & Plan Note (Addendum)
Well-controlled HTN - Home BP readings none available  No known complications  Plan:  1. Continue current BP regimen - Lisinopril 20mg  daily 2. Encourage improved lifestyle - low sodium diet, regular exercise 3. Start to monitor BP more frequently at home 4. Follow-up q 6 mo

## 2018-11-11 DIAGNOSIS — H353211 Exudative age-related macular degeneration, right eye, with active choroidal neovascularization: Secondary | ICD-10-CM | POA: Diagnosis not present

## 2019-01-13 DIAGNOSIS — H353211 Exudative age-related macular degeneration, right eye, with active choroidal neovascularization: Secondary | ICD-10-CM | POA: Diagnosis not present

## 2019-01-27 DIAGNOSIS — L821 Other seborrheic keratosis: Secondary | ICD-10-CM | POA: Diagnosis not present

## 2019-01-27 DIAGNOSIS — Z85828 Personal history of other malignant neoplasm of skin: Secondary | ICD-10-CM | POA: Diagnosis not present

## 2019-01-27 DIAGNOSIS — C44622 Squamous cell carcinoma of skin of right upper limb, including shoulder: Secondary | ICD-10-CM | POA: Diagnosis not present

## 2019-01-27 DIAGNOSIS — L570 Actinic keratosis: Secondary | ICD-10-CM | POA: Diagnosis not present

## 2019-04-14 DIAGNOSIS — H353211 Exudative age-related macular degeneration, right eye, with active choroidal neovascularization: Secondary | ICD-10-CM | POA: Diagnosis not present

## 2019-04-20 ENCOUNTER — Ambulatory Visit: Payer: Medicare Other | Admitting: Family Medicine

## 2019-06-07 ENCOUNTER — Other Ambulatory Visit: Payer: Self-pay | Admitting: Family Medicine

## 2019-06-07 DIAGNOSIS — I10 Essential (primary) hypertension: Secondary | ICD-10-CM

## 2019-06-08 ENCOUNTER — Encounter: Payer: Self-pay | Admitting: Family Medicine

## 2019-06-08 ENCOUNTER — Other Ambulatory Visit: Payer: Self-pay

## 2019-06-08 ENCOUNTER — Ambulatory Visit (INDEPENDENT_AMBULATORY_CARE_PROVIDER_SITE_OTHER): Payer: Medicare Other | Admitting: Family Medicine

## 2019-06-08 VITALS — BP 102/46 | HR 50 | Temp 97.6°F | Ht 67.0 in | Wt 146.2 lb

## 2019-06-08 DIAGNOSIS — G3184 Mild cognitive impairment, so stated: Secondary | ICD-10-CM | POA: Diagnosis not present

## 2019-06-08 DIAGNOSIS — R490 Dysphonia: Secondary | ICD-10-CM | POA: Diagnosis not present

## 2019-06-08 MED ORDER — DONEPEZIL HCL 5 MG PO TABS: 5.0000 mg | ORAL_TABLET | Freq: Every day | ORAL | 2 refills | Status: DC

## 2019-06-08 NOTE — Progress Notes (Addendum)
Subjective:    Patient ID: Travis Mccoy, male    DOB: Aug 08, 1922, 85 y.o.   MRN: [id number]  Travis Mccoy is a 85 y.o. male presenting on 06/08/2019 for Memory Loss (memory is worsening) and Hoarse (stuffy nose )  Patient accompanied by family, daughter Marlowe Kays) and caregiver Colbert Ewing), very limited history provided by patient due to poor hearing and memory.  HPI   Mild Dementia vs Mild Cognitive Impairment w/ Memory Loss History of prior scores 2017 MMSE 27/30, last AMW performed 6CIT score of +2 points from recall phrase mistake. - Patient lives independently, and caregiver Rise Paganini is with him from 8am to 2pm, and daughter is with him virtually 24/7 support, has other grandchildren or other family nearby - Today concern is that declining memory affecting his function more. Often forgetful with misplaced object, obssesses when cannot find thing until new one purchase, difficulty word finding or recall, he frequent asks repeats questions within short time interval - Patient is able to perform most ADLs - Limited exercise lately, family request motivation for him - Drinking a lot of milk regularly, rather than as much food. Drinks 2-3 gallons whole milk  Additional complaint Rhinitis / Hoarseness - congestion, has not tried OTC Flonase lately was no longer taking it. He has existing already needs to restart it for congestion.     Depression screen [facility] 2/9 10/19/2018 06/08/2018 10/08/2017  Decreased Interest 0 0 0  Down, Depressed, Hopeless 0 0 0  PHQ - 2 Score 0 0 0    Social History   Tobacco Use  . Smoking status: Former Smoker    Types: Cigarettes    Last attempt to quit: 08/30/1959    Years since quitting: 59.8  . Smokeless tobacco: Former Network engineer Use Topics  . Alcohol use: Yes    Alcohol/week: 4.0 standard drinks    Types: 4 Cans of beer per week  . Drug use: No    Review of Systems Per HPI unless specifically indicated above     Objective:    BP (!)  102/46 (BP Location: Right Arm, Patient Position: Sitting, Cuff Size: Normal)   Pulse (!) 50   Temp 97.6 F (36.4 C) (Oral)   Ht 5\' 7"  (1.702 m)   Wt 146 lb 3.2 oz (66.3 kg)   BMI 22.90 kg/m   Wt Readings from Last 3 Encounters:  06/08/19 146 lb 3.2 oz (66.3 kg)  10/19/18 152 lb (68.9 kg)  10/19/18 152 lb (68.9 kg)    Physical Exam Vitals signs and nursing note reviewed.  Constitutional:      General: He is not in acute distress.    Appearance: He is well-developed. He is not diaphoretic.     Comments: Chronically ill but currently well appearing 85 year old male, comfortable, cooperative  HENT:     Head: Normocephalic and atraumatic.     Ears:     Comments: No hearing aid in place Eyes:     General:        Right eye: No discharge.        Left eye: No discharge.     Conjunctiva/sclera: Conjunctivae normal.  Neck:     Musculoskeletal: Normal range of motion and neck supple.     Thyroid: No thyromegaly.  Cardiovascular:     Rate and Rhythm: Normal rate and regular rhythm.     Heart sounds: Normal heart sounds. No murmur.  Pulmonary:     Effort: Pulmonary effort is normal.  No respiratory distress.     Breath sounds: Normal breath sounds. No wheezing or rales.  Musculoskeletal:     Comments: Generalized mild muscle atrophy upper and lower ext  Lymphadenopathy:     Cervical: No cervical adenopathy.  Skin:    General: Skin is warm and dry.     Findings: No erythema or rash.  Neurological:     Mental Status: He is alert and oriented to person, place, and time.  Psychiatric:        Behavior: Behavior normal.     Comments: Well groomed, fairly good eye contact, normal speech and thoughts when he participates, due to poor hearing w/o hearing aid, he is not able to follow conversation well, no abnormal behaviors.      MMSE - Mini Mental State Exam 06/08/2019 01/29/2016  Orientation to time 0 5  Orientation to Place 5 5  Registration 3 3  Attention/ Calculation 5 5  Recall  0 0  Language- name 2 objects 2 2  Language- repeat 1 1  Language- follow 3 step command 3 3  Language- read & follow direction 1 1  Write a sentence 1 1  Copy design 1 1  Total score 22 27   6CIT Screen 10/19/2018 08/11/2017  What Year? 0 points 0 points  What month? 0 points 0 points  What time? 0 points 0 points  Count back from 20 0 points 0 points  Months in reverse 0 points 0 points  Repeat phrase 2 points 2 points  Total Score 2 2     Results for orders placed or performed in visit on 10/13/18  Lipid panel  Result Value Ref Range   Cholesterol 164 <200 mg/dL   HDL 49 >40 mg/dL   Triglycerides 108 <150 mg/dL   LDL Cholesterol (Calc) 95 mg/dL (calc)   Total CHOL/HDL Ratio 3.3 <5.0 (calc)   Non-HDL Cholesterol (Calc) 115 <130 mg/dL (calc)  COMPLETE METABOLIC PANEL WITH GFR  Result Value Ref Range   Glucose, Bld 109 (H) 65 - 99 mg/dL   BUN 24 7 - 25 mg/dL   Creat 1.06 0.70 - 1.11 mg/dL   GFR, Est Non African American 60 > OR = 60 mL/min/1.75m2   GFR, Est African American 69 > OR = 60 mL/min/1.69m2   BUN/Creatinine Ratio NOT APPLICABLE 6 - 22 (calc)   Sodium 138 135 - 146 mmol/L   Potassium 4.8 3.5 - 5.3 mmol/L   Chloride 104 98 - 110 mmol/L   CO2 29 20 - 32 mmol/L   Calcium 9.4 8.6 - 10.3 mg/dL   Total Protein 6.2 6.1 - 8.1 g/dL   Albumin 3.9 3.6 - 5.1 g/dL   Globulin 2.3 1.9 - 3.7 g/dL (calc)   AG Ratio 1.7 1.0 - 2.5 (calc)   Total Bilirubin 0.7 0.2 - 1.2 mg/dL   Alkaline phosphatase (APISO) 81 40 - 115 U/L   AST 20 10 - 35 U/L   ALT 12 9 - 46 U/L  CBC with Differential/Platelet  Result Value Ref Range   WBC 6.4 3.8 - 10.8 Thousand/uL   RBC 4.25 4.20 - 5.80 Million/uL   Hemoglobin 13.6 13.2 - 17.1 g/dL   HCT 39.6 38.5 - 50.0 %   MCV 93.2 80.0 - 100.0 fL   MCH 32.0 27.0 - 33.0 pg   MCHC 34.3 32.0 - 36.0 g/dL   RDW 12.9 11.0 - 15.0 %   Platelets 205 140 - 400 Thousand/uL   MPV 10.9 7.5 -  12.5 fL   Neutro Abs 3,693 1,500 - 7,800 cells/uL   Lymphs Abs  1,760 850 - 3,900 cells/uL   WBC mixed population 678 200 - 950 cells/uL   Eosinophils Absolute 179 15 - 500 cells/uL   Basophils Absolute 90 0 - 200 cells/uL   Neutrophils Relative % 57.7 %   Total Lymphocyte 27.5 %   Monocytes Relative 10.6 %   Eosinophils Relative 2.8 %   Basophils Relative 1.4 %  Hemoglobin A1c  Result Value Ref Range   Hgb A1c MFr Bld 6.4 (H) <5.7 % of total Hgb   Mean Plasma Glucose 137 (calc)   eAG (mmol/L) 7.6 (calc)      Assessment & Plan:   Problem List Items Addressed This Visit    Mild cognitive impairment with memory loss - Primary   Relevant Medications   donepezil (ARICEPT) 5 MG tablet    Other Visit Diagnoses    Hoarseness of voice         Start nasal steroid Flonase 2 sprays in each nostril daily for 4-6 weeks, may repeat course seasonally or as needed - Question if some dry cough from lisinopril - given low normal BP  HOLD lisinopril for now, possible ACEi cough  #memory loss Progression decline. At risk of mild dementia vs MCI FAST Stage 4 approx, nohistory of behavioral disturbance or safety concern Good caregiver support - Meds: none prior  Plan: 1. Reviewed Dementia prognosis and management, explained that may need additional support in future. - Start Aricept 5mg  daily for memory loss, discussed benefit risk, side effect diarrhea - Follow-up progress, future consider neurology vs namenda if indicated    Meds ordered this encounter  Medications  . donepezil (ARICEPT) 5 MG tablet    Sig: Take 1 tablet (5 mg total) by mouth daily with breakfast.    Dispense:  30 tablet    Refill:  2      Follow up plan: Return in about 3 months (around 09/08/2019) for Memory Loss MMSE / med adjust / BP.   [name], [facility] 06/08/2019, 8:50 AM

## 2019-06-08 NOTE — Patient Instructions (Addendum)
Thank you for coming to the office today.  HOLD Lisinopril for now to see if causing throat irritation horaseness  Restart Flonase in morning 2 sprays in each nostril daily takes several weeks for result  Start Donepezil 5mg  (generic Aricept) 5mg  daily in morning with breakfast, monitor for improvement in memory and preserved memory function goal to avoid significant loss of memory over 3-6 months.  If we need additional support we can contact our Nurse Case Management Team as well.  Recommend increasing regular exercise with walking regimen to keep strength.  Please schedule a Follow-up Appointment to: Return in about 3 months (around 09/08/2019) for Memory Loss MMSE / med adjust / BP.  If you have any other questions or concerns, please feel free to call the office or send a message through [place]. You may also schedule an earlier appointment if necessary.  Additionally, you may be receiving a survey about your experience at our office within a few days to 1 week by e-mail or mail. We value your feedback.  [name], DO [place]

## 2019-06-09 ENCOUNTER — Encounter: Payer: Self-pay | Admitting: Family Medicine

## 2019-06-09 DIAGNOSIS — F0150 Vascular dementia without behavioral disturbance: Secondary | ICD-10-CM | POA: Insufficient documentation

## 2019-06-16 DIAGNOSIS — H353211 Exudative age-related macular degeneration, right eye, with active choroidal neovascularization: Secondary | ICD-10-CM | POA: Diagnosis not present

## 2019-07-12 DIAGNOSIS — G3184 Mild cognitive impairment, so stated: Secondary | ICD-10-CM

## 2019-07-13 NOTE — Addendum Note (Signed)
Addended by: Olin Hauser on: 07/13/2019 04:55 PM   Modules accepted: Orders

## 2019-07-15 ENCOUNTER — Telehealth: Payer: Self-pay | Admitting: Family Medicine

## 2019-07-15 DIAGNOSIS — G3184 Mild cognitive impairment, so stated: Secondary | ICD-10-CM

## 2019-07-15 MED ORDER — DONEPEZIL HCL 5 MG PO TABS: 2.5000 mg | ORAL_TABLET | Freq: Every day | ORAL | 1 refills | Status: DC

## 2019-07-15 NOTE — Telephone Encounter (Signed)
Pt. Daughter called said that pt referral to the neurology was 6 weeks out, per daugther pt need appt.before then . She is requesting a new referral to  [facility], or  [facility]

## 2019-07-15 NOTE — Telephone Encounter (Signed)
Called patient's daughter Travis Mccoy back to discuss. See mychart message as well.  She already contacted [place] instead of 6 week now she was able to get apt moved up to 08/02/19 Virtual Visit, which is in slightly under 3 weeks  She is asking if other office can be quicker, I understand her concerns. I advised that this is about as fast as one can expect to get in for this type of chronic medical condition and memory / dementia evaluation.  I advised for now, keep 08/02/19 virtual apt, she is on cancellation list and can be moved up sooner if they are able.  He may benefit from neuropsych option available at [facility] in future.  She also said that he actually very well was nearly 100% improved on HALF dose Aricept 2.5mg  daily in AM, when he was tapering off of dose of 5mg  in recent past, which made him feel like "zombie".  I agreed to trial back on half dose Aricept sent new rx 5mg  tabs take HALF dose 2.5mg  daily.  She can review this med with neurology and adjust if need.  Answered questions. Addressed issues and she is currently comfortable with plan, will update [place] if need  [name], [facility] 07/15/2019, 5:25 PM

## 2019-07-18 ENCOUNTER — Telehealth: Payer: Self-pay

## 2019-07-18 DIAGNOSIS — G3184 Mild cognitive impairment, so stated: Secondary | ICD-10-CM

## 2019-07-18 MED ORDER — DONEPEZIL HCL 5 MG PO TABS: 2.5000 mg | ORAL_TABLET | Freq: Every day | ORAL | 1 refills | Status: DC

## 2019-07-18 NOTE — Telephone Encounter (Signed)
No PA needed. I called [facility], they can fill medicine for him today with new rx. Last rx 7/17 was sent to [facility] by request of patient's daughter, but she said [facility].  [facility] will only charge max retail price $9 for this med if it is not covered, no PA needed.  I called Marlowe Kays, she will [facility] for this med now instead of [facility].  If we receive PA from [facility], can cancel it or shred it.  [name], [facility] 07/18/2019, 5:35 PM

## 2019-07-18 NOTE — Telephone Encounter (Signed)
Travis Mccoy when to pick up Aricept rx from [facility] and they would not fill rx due to it being to soon.  She is calling for help because she trow away the other rx of Aricept after patient having side effects.

## 2019-07-18 NOTE — Telephone Encounter (Signed)
Needed PA waiting for paperwork.

## 2019-08-02 ENCOUNTER — Other Ambulatory Visit: Payer: Self-pay

## 2019-08-02 ENCOUNTER — Telehealth (INDEPENDENT_AMBULATORY_CARE_PROVIDER_SITE_OTHER): Payer: Medicare Other | Admitting: Neurology

## 2019-08-02 ENCOUNTER — Encounter

## 2019-08-02 ENCOUNTER — Encounter: Payer: Self-pay | Admitting: Neurology

## 2019-08-02 VITALS — Ht 67.0 in | Wt 146.0 lb

## 2019-08-02 DIAGNOSIS — F03A0 Unspecified dementia, mild, without behavioral disturbance, psychotic disturbance, mood disturbance, and anxiety: Secondary | ICD-10-CM

## 2019-08-02 DIAGNOSIS — F0390 Unspecified dementia without behavioral disturbance: Secondary | ICD-10-CM

## 2019-08-02 MED ORDER — ESCITALOPRAM OXALATE 10 MG PO TABS: ORAL_TABLET | ORAL | 11 refills | Status: DC

## 2019-08-02 NOTE — Progress Notes (Signed)
Virtual Visit via Video Note The purpose of this virtual visit is to provide medical care while limiting exposure to the novel coronavirus.    Consent was obtained for video visit:  Yes.   Answered questions that patient had about telehealth interaction:  Yes.   I discussed the limitations, risks, security and privacy concerns of performing an evaluation and management service by telemedicine. I also discussed with the patient that there may be a patient responsible charge related to this service. The patient expressed understanding and agreed to proceed.  Pt location: Home Physician Location: office Name of referring provider:  [name] * I connected with Travis Mccoy at patients initiation/request on 08/02/2019 at  9:00 AM EDT by video enabled telemedicine application and verified that I am speaking with the correct person using two identifiers. Pt MRN:  [id number] Pt DOB:  June 22, 1922 Video Participants:  Travis Mccoy;  Sheryle Hail (daughter), Jeannene Patella (daughter), Rise Paganini (aide)   History of Present Illness: This is a pleasant 85 year old right-handed man with a history of hypertension, hyperlipidemia, presenting for evaluation of dementia. He feels his memory is "pretty good, not real good but okay." His daughter started noticing personality and memory changes in the Fall of 2019, he was starting to get more obsessive, gradually worsening to the point where he would have one thing in his mind and would focus on it until family did something about it. He got paranoid about people coming in the yard, now the garage door is closed all the time. He is usually very social with his neighbors but has been obsessive about one of his neighbors that he dislikes, he had Rise Paganini get airhorns to blow out him when out in the yard. He would hear something when they talk on the phone with him and ask if the phone is bugged. He has a gun at his bedside table and told family one time he heard someone  and brought the gun to the door. He has also been misplacing things frequently, misplacing the garage door opener, remote control, his wallet. His daughter asked for his social security number and he gave his phone number. He has a harder time opening his tool box. He forgets how to use the microwave despite having written instructions. He lives alone and has an aide for the past year, Rise Paganini, who comes 6 hours a day, 5 days a week. She has been driving him around. She notes he repeats himself several times, and keeps asking until he gets the answer he is looking for. His daugter took over finances a year ago, he was doing a pretty good job but taking a longer time due to macular degeneration. He denies forgetting medications, family has noticed he would miss 3 nights out of the week in his pillbox. He was started on Donepezil 5mg  daily which caused him to be a zombie with confusion, he would have bad dreams and would wake up not knowing who or where he was. He would be up with the lights on, with his cabinets open. He also had incontinence. Dose was reduced to 2.5mg  which worked better for him, his obsessiveness is noticeably better even on low dose, no further incontinence but he occasionally wears adult diapers. They tried to stop it but the obsessiveness recurred. He thinks his mood is pretty good. Sleep is okay. He is independent with dressing and bathing but bathes every 2-3 days and wears the same clothes 2-3 days in a row. His  sister has memory issues. He drinks 1-2 beers a week. No history of significant head injuries.   He denies any headaches, dizziness, diplopia, dysarthria, dysphagia, neck/back pain, focal numbness/tingling/weakness. No anosmia, tremors, no falls. He has occasional constipation.    PAST MEDICAL HISTORY: Past Medical History:  Diagnosis Date   GERD (gastroesophageal reflux disease)    Hyperlipidemia    Hypertension     PAST SURGICAL HISTORY: Past Surgical History:    Procedure Laterality Date   HERNIA REPAIR  2009   SKIN CANCER EXCISION Right 10/18/2018   ear    MEDICATIONS: Current Outpatient Medications on File Prior to Visit  Medication Sig Dispense Refill   aspirin 81 MG tablet Take 1 tablet (81 mg total) by mouth daily. 90 tablet 3   donepezil (ARICEPT) 5 MG tablet Take 0.5 tablets (2.5 mg total) by mouth daily. May increase up to 1 whole pill in future if needed. 30 tablet 1   fluticasone (FLONASE) 50 MCG/ACT nasal spray Place 2 sprays into both nostrils daily. 48 g 12   lisinopril (ZESTRIL) 20 MG tablet TAKE 1 TABLET BY MOUTH  DAILY 90 tablet 3   Multiple Vitamins-Minerals (PRESERVISION AREDS 2+MULTI VIT PO) Take by mouth.     omeprazole (PRILOSEC) 20 MG capsule Take 1 capsule (20 mg total) by mouth daily. 90 capsule 3   Polyethyl Glycol-Propyl Glycol 0.4-0.3 % SOLN Apply to eye.     pravastatin (PRAVACHOL) 20 MG tablet Take 1 tablet by mouth every other evening 45 tablet 3   No current facility-administered medications on file prior to visit.     ALLERGIES: No Known Allergies  FAMILY HISTORY: Family History  Problem Relation Age of Onset   Stroke Father       Observations/Objective:   Vitals:   08/02/19 0846  Weight: 146 lb (66.2 kg)  Height: 5\' 7"  (1.702 m)   GEN:  The patient appears stated age and is in NAD.  Neurological examination: Patient is awake, alert, oriented to person, place, month/day of week. Year is 35. No aphasia or dysarthria.Reduced fluency, able to follow commands. Remote and recent memory impaired. Able to name and repeat. Cranial nerves: Extraocular movements intact with no nystagmus. No facial asymmetry. Motor: moves all extremities symmetrically, at least anti-gravity x 4. No incoordination on finger to nose testing. Gait: wide-based, no ataxia, ambulated without cane.  Montreal Cognitive Assessment  08/02/2019  Visuospatial/ Executive (0/5) 2  Naming (0/3) 3  Attention: Read list of digits  (0/2) 2  Attention: Read list of letters (0/1) 1  Attention: Serial 7 subtraction starting at 100 (0/3) 2  Language: Repeat phrase (0/2) 1  Language : Fluency (0/1) 0  Abstraction (0/2) 0  Delayed Recall (0/5) 0  Orientation (0/6) 4  Total 15   Assessment and Plan:   This is a pleasant 85 year old right-handed man with a history of hypertension, hyperlipidemia, presenting for evaluation of dementia. MOCA score today 15/30. Symptoms suggestive of Alzheimer's disease with behavioral disturbance, mild to moderate. He has not had prior brain imaging, head CT without contrast will be ordered to assess for underlying structural abnormality, check TSH and B12. He could not tolerate higher doses of Donepezil, he is on a very low dose 2.5mg  daily but family has noticed improvement of behaviors on this dose. Continue low dose. We discussed home safety, including gun safety, I discussed taking the gun away from home, family members will address this with him. He does not drive. Continue close supervision.  Follow-up in 6 months, they know to call for any changes.  Follow Up Instructions:   -I discussed the assessment and treatment plan with the patient/family. The patient/family were provided an opportunity to ask questions and all were answered. The patient/family agreed with the plan and demonstrated an understanding of the instructions.   The patient was advised to call back or seek an in-person evaluation if the symptoms worsen or if the condition fails to improve as anticipated.   Cameron Sprang, MD

## 2019-08-08 ENCOUNTER — Other Ambulatory Visit: Payer: Self-pay

## 2019-08-08 ENCOUNTER — Ambulatory Visit
Admission: RE | Admit: 2019-08-08 | Discharge: 2019-08-08 | Disposition: A | Discharge status: Home / Self Care | Payer: Medicare Other | Source: Ambulatory Visit | Attending: Neurology | Admitting: Neurology

## 2019-08-08 DIAGNOSIS — R410 Disorientation, unspecified: Secondary | ICD-10-CM | POA: Diagnosis not present

## 2019-08-08 DIAGNOSIS — F03A0 Unspecified dementia, mild, without behavioral disturbance, psychotic disturbance, mood disturbance, and anxiety: Secondary | ICD-10-CM

## 2019-08-08 DIAGNOSIS — F0390 Unspecified dementia without behavioral disturbance: Secondary | ICD-10-CM

## 2019-08-09 ENCOUNTER — Telehealth: Payer: Self-pay

## 2019-08-09 NOTE — Telephone Encounter (Signed)
Left message informing daughter Marlowe Kays of results and to call if she had any concerns.

## 2019-08-09 NOTE — Telephone Encounter (Signed)
-----   Message from Cameron Sprang, MD sent at 08/09/2019  8:32 AM EDT ----- Pls let daughter know that I reviewed the CT scan, there is no evidence of tumor, stroke, or bleed. It shows age-related changes and hardening of the small blood vessels in the brain that is seen in patients with blood pressure, cholesterol, or sugar control issues. This can cause cognitive issues, called vascular cognitive impairment/vascular dementia. Thanks

## 2019-08-18 DIAGNOSIS — H353211 Exudative age-related macular degeneration, right eye, with active choroidal neovascularization: Secondary | ICD-10-CM | POA: Diagnosis not present

## 2019-08-19 DIAGNOSIS — M79674 Pain in right toe(s): Secondary | ICD-10-CM | POA: Diagnosis not present

## 2019-08-19 DIAGNOSIS — B351 Tinea unguium: Secondary | ICD-10-CM | POA: Diagnosis not present

## 2019-08-19 DIAGNOSIS — L600 Ingrowing nail: Secondary | ICD-10-CM | POA: Diagnosis not present

## 2019-08-19 DIAGNOSIS — M2012 Hallux valgus (acquired), left foot: Secondary | ICD-10-CM | POA: Diagnosis not present

## 2019-08-19 DIAGNOSIS — M2011 Hallux valgus (acquired), right foot: Secondary | ICD-10-CM | POA: Diagnosis not present

## 2019-08-24 ENCOUNTER — Other Ambulatory Visit: Payer: Self-pay | Admitting: Neurology

## 2019-08-24 MED ORDER — ESCITALOPRAM OXALATE 10 MG PO TABS: ORAL_TABLET | ORAL | 3 refills | Status: DC

## 2019-08-26 ENCOUNTER — Ambulatory Visit: Payer: Self-pay | Admitting: Neurology

## 2019-08-30 ENCOUNTER — Other Ambulatory Visit: Payer: Self-pay

## 2019-08-30 MED ORDER — ESCITALOPRAM OXALATE 10 MG PO TABS: ORAL_TABLET | ORAL | 3 refills | Status: DC

## 2019-09-08 ENCOUNTER — Other Ambulatory Visit: Payer: Self-pay

## 2019-09-08 ENCOUNTER — Encounter: Payer: Self-pay | Admitting: Family Medicine

## 2019-09-08 ENCOUNTER — Ambulatory Visit (INDEPENDENT_AMBULATORY_CARE_PROVIDER_SITE_OTHER): Payer: Medicare Other | Admitting: Family Medicine

## 2019-09-08 VITALS — BP 137/54 | HR 68 | Temp 97.8°F | Resp 16 | Ht 67.0 in | Wt 142.0 lb

## 2019-09-08 DIAGNOSIS — F0151 Vascular dementia with behavioral disturbance: Secondary | ICD-10-CM

## 2019-09-08 DIAGNOSIS — F0390 Unspecified dementia without behavioral disturbance: Secondary | ICD-10-CM | POA: Diagnosis not present

## 2019-09-08 DIAGNOSIS — Z23 Encounter for immunization: Secondary | ICD-10-CM

## 2019-09-08 DIAGNOSIS — F01518 Vascular dementia, unspecified severity, with other behavioral disturbance: Secondary | ICD-10-CM

## 2019-09-08 DIAGNOSIS — H547 Unspecified visual loss: Secondary | ICD-10-CM

## 2019-09-08 DIAGNOSIS — H9113 Presbycusis, bilateral: Secondary | ICD-10-CM

## 2019-09-08 MED ORDER — DONEPEZIL HCL 5 MG PO TABS: 2.5000 mg | ORAL_TABLET | Freq: Every day | ORAL | 1 refills | Status: DC

## 2019-09-08 NOTE — Patient Instructions (Addendum)
Thank you for coming to the office today.  Labs today - TSH Thyroid, Vitamin B12 Also added routine Chemistry and Blood Count as well No blood needed in October 2020  High dose Flu shot today  Follow up with Dr Delice Lesch as planned, she will get lab results now.  Re ordered Aricept 2.5mg  (half of 5mg ) to [facility] 90 days and 1 refill  Continue on lexapro 10mg  if this is beneficial sounds to be working well.  Please schedule a Follow-up Appointment to: Return in about 6 months (around 03/07/2020) for Follow-up 6 month check up dementia, neuro updates.  If you have any other questions or concerns, please feel free to call the office or send a message through [place]. You may also schedule an earlier appointment if necessary.  Additionally, you may be receiving a survey about your experience at our office within a few days to 1 week by e-mail or mail. We value your feedback.  [name], DO [place]

## 2019-09-08 NOTE — Assessment & Plan Note (Signed)
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 Also possible underlying Alzheimer's dementia component. Clinically FAST Stage 4-5 without behavioral disturbances or concern for safety - Meds: Aricept 2.5 (had intolerance on 5)  Plan: 1. Reviewed Dementia prognosis and management, explained that may need additional support in future - currently has very good caregiver support. 2. Continue f/u with Dr Delice Lesch with Neurology 3. Check TSH, Vitamin B12 and CMET / CBC today for further eval and diagnostic - as requested by Neurology, will forward results once available 4. Re order Donepezil (Aricept) 2.5mg  daily (#45 of 5mg  tabs cut in half) 5. Continue current SSRI Escitalopram 10mg  per neuro due to anxiety / obsessiveness  6. Follow-up 6 months after Neuro

## 2019-09-08 NOTE — Progress Notes (Addendum)
Subjective:    Patient ID: Travis Mccoy, male    DOB: 04-Nov-1922, 85 y.o.   MRN: [id number]  Travis Mccoy is a 85 y.o. male presenting on 09/08/2019 for Dementia   Patient accompanied by family, daughter Marlowe Kays) and caregiver Colbert Ewing), very limited history provided by patient due to poor hearing and memory.  HPI   Vascular Dementia with Behavioral Disturbance Chronic Hearing Loss (Presbycusis) Chronic Reduced Vision - Last visit with me 05/2019, same problem, treated with referral to [facility] and trial on Aricept, see prior notes for background information. - Interval update with intolerance issues on 5mg  aricept, reduced to 2.5mg  dosage daily with better results, seen by Dr Ellouise Newer at [facility] on 08/02/19 for initial consultation, had CT imaging done, see results below suggestive of vascular dementia etiology, had MOCA 15/30 score based on documentation, and trial on low dose escitalopram 5mg  daily, which was later increased to 10mg  daily with better results for his anxiety/obessiveness - Today patient has no new concerns. Family / caregiver report updates and they believe that medications are beneficial and helping him. They request labs today that were recommended by Neurology, TSH and Vit B12 -ask to get drawn today - Currently taking: Donepezil (Aricept) 2.5mg  daily (half of 5mg  tab), Escitalopram 10mg  daily - Symptoms are similar to last visit, with forgetfulness, memory loss, word finding difficulty especially reported recently - Patient is able to perform some ADLs with dressing but choosing same clothes to re-wear for 2-3 days at a time, he does require some assistance with bathing every 2-3 days, he is able to use bathroom but he does have incontinence. He is able to do very basic meal prep and feed but has difficulty with any more complex meals and food preparation. - Admits Incontinence urinary, wears depends - no significant problems however, often changed  frequently - No more alcohol nightly, reduced urination overnight - Admits some episodes of behavioral changes Denies depression Denies any significant worse behavior, hallucinations, agitation or violence, risk of harm to self or others  Health Maintenance: Due for Flu Shot, will receive today    Depression screen [facility] 2/9 09/08/2019 10/19/2018 06/08/2018  Decreased Interest 0 0 0  Down, Depressed, Hopeless 0 0 0  PHQ - 2 Score 0 0 0  Altered sleeping 2 - -  Tired, decreased energy 1 - -  Change in appetite 0 - -  Feeling bad or failure about yourself  0 - -  Trouble concentrating 3 - -  Moving slowly or fidgety/restless 2 - -  Suicidal thoughts 0 - -  PHQ-9 Score 8 - -  Difficult doing work/chores Not difficult at all - -   GAD 7 : Generalized Anxiety Score 09/08/2019  Nervous, Anxious, on Edge 2  Control/stop worrying 3  Worry too much - different things 3  Trouble relaxing 3  Restless 3  Easily annoyed or irritable 2  Afraid - awful might happen 0  Total GAD 7 Score 16  Anxiety Difficulty Somewhat difficult     Social History   Tobacco Use  . Smoking status: Former Smoker    Types: Cigarettes    Quit date: 08/30/1959    Years since quitting: 60.1  . Smokeless tobacco: Former Network engineer Use Topics  . Alcohol use: Yes    Alcohol/week: 4.0 standard drinks    Types: 4 Cans of beer per week  . Drug use: No    Review of Systems Per HPI unless specifically  indicated above     Objective:    BP (!) 137/54   Pulse 68   Temp 97.8 F (36.6 C) (Oral)   Resp 16   Ht 5\' 7"  (1.702 m)   Wt 142 lb (64.4 kg)   BMI 22.24 kg/m   Wt Readings from Last 3 Encounters:  09/08/19 142 lb (64.4 kg)  08/02/19 146 lb (66.2 kg)  06/08/19 146 lb 3.2 oz (66.3 kg)    Physical Exam Vitals signs and nursing note reviewed.  Constitutional:      General: He is not in acute distress.    Appearance: He is well-developed. He is not diaphoretic.     Comments: Chronically ill but  currently well appearing 85 year old male, comfortable, cooperative  HENT:     Head: Normocephalic and atraumatic.  Eyes:     General:        Right eye: No discharge.        Left eye: No discharge.     Conjunctiva/sclera: Conjunctivae normal.  Neck:     Musculoskeletal: Normal range of motion and neck supple.     Thyroid: No thyromegaly.  Cardiovascular:     Rate and Rhythm: Normal rate and regular rhythm.     Heart sounds: Normal heart sounds. No murmur.  Pulmonary:     Effort: Pulmonary effort is normal. No respiratory distress.     Breath sounds: Normal breath sounds. No wheezing or rales.  Musculoskeletal:     Comments: Generalized mild muscle atrophy upper and lower ext  Lymphadenopathy:     Cervical: No cervical adenopathy.  Skin:    General: Skin is warm and dry.     Findings: No erythema or rash.  Neurological:     Mental Status: He is alert and oriented to person, place, and time.  Psychiatric:        Behavior: Behavior normal.     Comments: Well groomed, fairly good eye contact, normal speech and thoughts when he participates - limited accuracy with conversation due to poor hearing, not able to follow conversation well, no abnormal behaviors      I have personally reviewed the radiology report  [place] 08/08/2019 Final result  Notes recorded by Cameron Sprang, MD on 08/09/2019 at 8:32 AM EDT Pls let daughter know that I reviewed the CT scan, there is no evidence of tumor, stroke, or bleed. It shows age-related changes and hardening of the small blood vessels in the brain that is seen in patients with blood pressure, cholesterol, or sugar control issues. This can cause cognitive issues, called vascular cognitive impairment/vascular dementia. Thanks   Study Result CLINICAL DATA: Episodic confusion  EXAM: CT HEAD WITHOUT CONTRAST  TECHNIQUE: Contiguous axial images were obtained from the base of the skull through the vertex without intravenous  contrast.  COMPARISON: None.  FINDINGS: Brain: There is moderate diffuse atrophy. There is no intracranial mass, hemorrhage, extra-axial fluid collection, or midline shift. There is extensive small vessel disease throughout the centra semiovale bilaterally. There is evidence of a prior focal infarct in the anterior left centrum semiovale. There is small vessel disease in portions of each internal and external capsule as well as in each lentiform nucleus. Small vessel disease is noted in the right thalamus. There is decreased attenuation throughout much of the lower midbrain and pons regions consistent with basilar perforator distribution small vessel disease.4 no acute appearing infarct is demonstrable on this study.  Vascular: No hyperdense vessels are evident. There  is calcification in each distal vertebral artery and carotid siphon region.  Skull: Bony calvarium appears intact.  Sinuses/Orbits: There is mucosal thickening in several ethmoid air cells. There is mild mucosal thickening in the medial right maxillary antrum. Other visualized paranasal sinuses are clear. No intraorbital lesions are evident.  Other: Mastoid air cells are clear.  IMPRESSION: Atrophy with extensive small vessel disease throughout the supratentorial white matter as well as in the basilar perforator distribution. No acute infarct evident. No mass or hemorrhage.  Multiple foci of arterial vascular calcification noted. Mucosal thickening noted in several paranasal sinuses.   Electronically Signed By: Lowella Grip III M.D. On: 08/08/2019 08:49   Results for orders placed or performed in visit on 09/08/19  TSH  Result Value Ref Range   TSH 1.63 0.40 - 4.50 mIU/L  Vitamin B12  Result Value Ref Range   Vitamin B-12 545 200 - 1,100 pg/mL  CBC with Differential/Platelet  Result Value Ref Range   WBC 6.2 3.8 - 10.8 Thousand/uL   RBC 4.08 (L) 4.20 - 5.80 Million/uL   Hemoglobin 13.4 13.2 -  17.1 g/dL   HCT 39.4 38.5 - 50.0 %   MCV 96.6 80.0 - 100.0 fL   MCH 32.8 27.0 - 33.0 pg   MCHC 34.0 32.0 - 36.0 g/dL   RDW 12.2 11.0 - 15.0 %   Platelets 205 140 - 400 Thousand/uL   MPV 10.6 7.5 - 12.5 fL   Neutro Abs 3,441 1,500 - 7,800 cells/uL   Lymphs Abs 1,841 850 - 3,900 cells/uL   Absolute Monocytes 707 200 - 950 cells/uL   Eosinophils Absolute 149 15 - 500 cells/uL   Basophils Absolute 62 0 - 200 cells/uL   Neutrophils Relative % 55.5 %   Total Lymphocyte 29.7 %   Monocytes Relative 11.4 %   Eosinophils Relative 2.4 %   Basophils Relative 1.0 %  COMPLETE METABOLIC PANEL WITH GFR  Result Value Ref Range   Glucose, Bld 120 (H) 65 - 99 mg/dL   BUN 23 7 - 25 mg/dL   Creat 1.03 0.70 - 1.11 mg/dL   GFR, Est Non African American 62 > OR = 60 mL/min/1.67m2   GFR, Est African American 72 > OR = 60 mL/min/1.58m2   BUN/Creatinine Ratio NOT APPLICABLE 6 - 22 (calc)   Sodium 142 135 - 146 mmol/L   Potassium 5.2 3.5 - 5.3 mmol/L   Chloride 104 98 - 110 mmol/L   CO2 29 20 - 32 mmol/L   Calcium 9.8 8.6 - 10.3 mg/dL   Total Protein 6.5 6.1 - 8.1 g/dL   Albumin 4.2 3.6 - 5.1 g/dL   Globulin 2.3 1.9 - 3.7 g/dL (calc)   AG Ratio 1.8 1.0 - 2.5 (calc)   Total Bilirubin 0.6 0.2 - 1.2 mg/dL   Alkaline phosphatase (APISO) 76 35 - 144 U/L   AST 24 10 - 35 U/L   ALT 16 9 - 46 U/L      Assessment & Plan:   Problem List Items Addressed This Visit    Hearing loss   Reduced vision   Vascular dementia without behavioral disturbance ([place]) - Primary    Currently stable without significant decline in past 3 months Followed by [facility] Neurology now since 07/2019, has further diagnostic now with likely Vascular Dementia component, based on CT imaging. Also possible underlying Alzheimer's dementia component. Clinically FAST Stage 4-5 without behavioral disturbances or concern for safety - Meds: Aricept 2.5 (had intolerance on 5)  Plan:  1. Reviewed Dementia prognosis and management, explained  that may need additional support in future - currently has very good caregiver support. 2. Continue f/u with Dr Delice Lesch with Neurology 3. Check TSH, Vitamin B12 and CMET / CBC today for further eval and diagnostic - as requested by Neurology, will forward results once available 4. Re order Donepezil (Aricept) 2.5mg  daily (#45 of 5mg  tabs cut in half) 5. Continue current SSRI Escitalopram 10mg  per neuro due to anxiety / obsessiveness  6. Follow-up 6 months after Neuro      Relevant Medications   donepezil (ARICEPT) 5 MG tablet    Other Visit Diagnoses    Needs flu shot       Relevant Orders   Flu Vaccine QUAD High Dose(Fluad) (Completed)      Meds ordered this encounter  Medications  . donepezil (ARICEPT) 5 MG tablet    Sig: Take 0.5 tablets (2.5 mg total) by mouth daily.    Dispense:  45 tablet    Refill:  1    Follow up plan: Return in about 6 months (around 03/07/2020) for Follow-up 6 month check up dementia, neuro updates.  Labs today, follow up results  [name], [facility] 09/08/2019, 10:44 AM

## 2019-09-09 LAB — CBC WITH DIFFERENTIAL/PLATELET
Absolute Monocytes: 707 cells/uL (ref 200–950)
Basophils Absolute: 62 cells/uL (ref 0–200)
Basophils Relative: 1 %
Eosinophils Absolute: 149 cells/uL (ref 15–500)
Eosinophils Relative: 2.4 %
HCT: 39.4 % (ref 38.5–50.0)
Hemoglobin: 13.4 g/dL (ref 13.2–17.1)
Lymphs Abs: 1841 cells/uL (ref 850–3900)
MCH: 32.8 pg (ref 27.0–33.0)
MCHC: 34 g/dL (ref 32.0–36.0)
MCV: 96.6 fL (ref 80.0–100.0)
MPV: 10.6 fL (ref 7.5–12.5)
Monocytes Relative: 11.4 %
Neutro Abs: 3441 cells/uL (ref 1500–7800)
Neutrophils Relative %: 55.5 %
Platelets: 205 10*3/uL (ref 140–400)
RBC: 4.08 10*6/uL — ABNORMAL LOW (ref 4.20–5.80)
RDW: 12.2 % (ref 11.0–15.0)
Total Lymphocyte: 29.7 %
WBC: 6.2 10*3/uL (ref 3.8–10.8)

## 2019-09-09 LAB — COMPLETE METABOLIC PANEL WITH GFR
AG Ratio: 1.8 (calc) (ref 1.0–2.5)
ALT: 16 U/L (ref 9–46)
AST: 24 U/L (ref 10–35)
Albumin: 4.2 g/dL (ref 3.6–5.1)
Alkaline phosphatase (APISO): 76 U/L (ref 35–144)
BUN: 23 mg/dL (ref 7–25)
CO2: 29 mmol/L (ref 20–32)
Calcium: 9.8 mg/dL (ref 8.6–10.3)
Chloride: 104 mmol/L (ref 98–110)
Creat: 1.03 mg/dL (ref 0.70–1.11)
GFR, Est African American: 72 mL/min/{1.73_m2} (ref >=60)
GFR, Est Non African American: 62 mL/min/{1.73_m2} (ref >=60)
Globulin: 2.3 g/dL (calc) (ref 1.9–3.7)
Glucose, Bld: 120 mg/dL — ABNORMAL HIGH (ref 65–99)
Potassium: 5.2 mmol/L (ref 3.5–5.3)
Sodium: 142 mmol/L (ref 135–146)
Total Bilirubin: 0.6 mg/dL (ref 0.2–1.2)
Total Protein: 6.5 g/dL (ref 6.1–8.1)

## 2019-09-09 LAB — TSH: TSH: 1.63 mIU/L (ref 0.40–4.50)

## 2019-09-09 LAB — VITAMIN B12: Vitamin B-12: 545 pg/mL (ref 200–1100)

## 2019-10-03 DIAGNOSIS — H547 Unspecified visual loss: Secondary | ICD-10-CM | POA: Insufficient documentation

## 2019-10-03 DIAGNOSIS — H9190 Unspecified hearing loss, unspecified ear: Secondary | ICD-10-CM | POA: Insufficient documentation

## 2019-10-21 ENCOUNTER — Telehealth: Payer: Self-pay | Admitting: Family Medicine

## 2019-10-21 NOTE — Telephone Encounter (Signed)
I called the patient to schedule AWV with Tiffany, but there was no answer.

## 2019-10-26 DIAGNOSIS — H353211 Exudative age-related macular degeneration, right eye, with active choroidal neovascularization: Secondary | ICD-10-CM | POA: Diagnosis not present

## 2019-11-10 ENCOUNTER — Other Ambulatory Visit: Payer: Self-pay | Admitting: Neurology

## 2019-11-10 MED ORDER — MEMANTINE HCL 5 MG PO TABS: ORAL_TABLET | ORAL | 0 refills | Status: DC

## 2019-11-14 ENCOUNTER — Telehealth: Payer: Self-pay | Admitting: Adult Health Nurse Practitioner

## 2019-11-14 NOTE — Telephone Encounter (Signed)
Called daughter Marlowe Kays, to schedule a Palliative Consult, no answer - left message with reason for call along with my contact information.

## 2019-11-15 ENCOUNTER — Other Ambulatory Visit: Payer: Self-pay

## 2019-11-15 ENCOUNTER — Encounter: Payer: Self-pay | Admitting: Family Medicine

## 2019-11-15 ENCOUNTER — Other Ambulatory Visit: Payer: Self-pay | Admitting: Family Medicine

## 2019-11-15 ENCOUNTER — Ambulatory Visit (INDEPENDENT_AMBULATORY_CARE_PROVIDER_SITE_OTHER): Payer: Medicare Other | Admitting: Family Medicine

## 2019-11-15 VITALS — BP 148/67 | HR 66 | Temp 97.7°F | Resp 16 | Ht 67.0 in | Wt 138.8 lb

## 2019-11-15 DIAGNOSIS — R350 Frequency of micturition: Secondary | ICD-10-CM

## 2019-11-15 DIAGNOSIS — N401 Enlarged prostate with lower urinary tract symptoms: Secondary | ICD-10-CM

## 2019-11-15 DIAGNOSIS — R351 Nocturia: Secondary | ICD-10-CM

## 2019-11-15 DIAGNOSIS — E782 Mixed hyperlipidemia: Secondary | ICD-10-CM

## 2019-11-15 DIAGNOSIS — R7303 Prediabetes: Secondary | ICD-10-CM

## 2019-11-15 DIAGNOSIS — K2100 Gastro-esophageal reflux disease with esophagitis, without bleeding: Secondary | ICD-10-CM

## 2019-11-15 LAB — POCT URINALYSIS DIPSTICK
Bilirubin, UA: NEGATIVE
Blood, UA: NEGATIVE
Glucose, UA: NEGATIVE
Ketones, UA: NEGATIVE
Nitrite, UA: NEGATIVE
Protein, UA: POSITIVE — AB
Spec Grav, UA: 1.02 (ref 1.010–1.025)
Urobilinogen, UA: 0.2 E.U./dL
pH, UA: 5 (ref 5.0–8.0)

## 2019-11-15 LAB — POCT GLYCOSYLATED HEMOGLOBIN (HGB A1C): Hemoglobin A1C: 6.1 % — AB (ref 4.0–5.6)

## 2019-11-15 MED ORDER — TAMSULOSIN HCL 0.4 MG PO CAPS: 0.4000 mg | ORAL_CAPSULE | Freq: Every day | ORAL | 2 refills | Status: DC

## 2019-11-15 NOTE — Assessment & Plan Note (Addendum)
Improved A1c down to 6.1, from prior 6.4 Reassuring seems still in PreDM range but improved significantly Less likely to be cause of nocturia / urinary frequency with improved sugar Encourage continue healthy balanced diet No medication or other treatment required.

## 2019-11-15 NOTE — Assessment & Plan Note (Signed)
History and exam consistent today with BPH with nocturia / urinary frequency LUTS - AUA BPH score 11 today - No prior prostate meds before - Last DRE done today with mild to moderate enlargement of prostate without other abnormality - No known personal/family history of prostate CA  Plan: As requested to proceed now - will start Tamsulosin 0.4mg  daily, advised on benefits, risks, if BP low caution with sudden standing up or position change  Follow-up symptom relief on alpha blocker, future can consider other med options or urology if indicated for OAB vs other prostate BPH meds

## 2019-11-15 NOTE — Progress Notes (Signed)
Subjective:    Patient ID: Travis Mccoy, male    DOB: [birth year], 85 y.o.   MRN: [id number]  Travis Mccoy is a 85 y.o. male presenting on 11/15/2019 for Urinary Tract Infection and Hypertension   HPI   [place] here today provides additional history. Patient has dementia and unable to provide all of the pertinent history. He provides some symptoms today based on his report.  BPH Urinary Frequency and Nocturia Recent history communication in [employer] with patient's daughter, caregiver Travis Mccoy. She expressed concerns with increased urinary frequency. Especially waking up at night 4-6 times to urinate with nocturia, he wears depends and has some occasional urinary incontinence accidents. Concerns with pre-diabetes if he had elevated sugar or UTI at that time. - Today patient is doing well. He has no specific concerns of his own, he does admit to urinating often. Caregiver today confirms day and night-time urinary frequency, occasional incontinence episodes, he does not seem to complain of full bladder or abdominal pelvic pain, or flank pain  Based on both patient / caregiver responses  AUA BPH Symptom Score over past 1 month 1. Sensation of not emptying bladder post void - 0 2. Urinate less than 2 hour after finish last void - 4 3. Start/Stop several times during void - 0 4. Difficult to postpone urination - 2 5. Weak urinary stream - 0 6. Push or strain urination - 0 7. Nocturia - 5 times  Total Score: 11 (Moderate BPH symptoms)  Hisotry of elevated A1c / hyperglycemia or pre-diabetes Prior results over past few years, including last year had A1c 6.2 to 6.4. Today POC A1c due. See results below There were no significant concerns with blood sugar reported based on diet.    Depression screen [facility] 2/9 11/15/2019 09/08/2019 10/19/2018  Decreased Interest 0 0 0  Down, Depressed, Hopeless 0 0 0  PHQ - 2 Score 0 0 0  Altered sleeping 0 2 -  Tired, decreased energy 0 1  -  Change in appetite 0 0 -  Feeling bad or failure about yourself  0 0 -  Trouble concentrating 0 3 -  Moving slowly or fidgety/restless 0 2 -  Suicidal thoughts 0 0 -  PHQ-9 Score 0 8 -  Difficult doing work/chores - Not difficult at all -    Social History   Tobacco Use  . Smoking status: Former Smoker    Types: Cigarettes    Quit date: 08/30/1959    Years since quitting: 60.2  . Smokeless tobacco: Former Network engineer Use Topics  . Alcohol use: Not Currently  . Drug use: No    Review of Systems Per HPI unless specifically indicated above     Objective:    BP (!) 148/67 (BP Location: Right Arm, Patient Position: Sitting, Cuff Size: Normal)   Pulse 66   Temp 97.7 F (36.5 C) (Oral)   Resp 16   Ht 5\' 7"  (1.702 m)   Wt 138 lb 12.8 oz (63 kg)   SpO2 99%   BMI 21.74 kg/m   Wt Readings from Last 3 Encounters:  11/15/19 138 lb 12.8 oz (63 kg)  09/08/19 142 lb (64.4 kg)  08/02/19 146 lb (66.2 kg)    Physical Exam Vitals signs and nursing note reviewed.  Constitutional:      General: He is not in acute distress.    Appearance: He is well-developed. He is not diaphoretic.     Comments: Well-appearing 85 year old male,  comfortable, cooperative  HENT:     Head: Normocephalic and atraumatic.  Eyes:     General:        Right eye: No discharge.        Left eye: No discharge.     Conjunctiva/sclera: Conjunctivae normal.  Neck:     Musculoskeletal: Normal range of motion and neck supple.     Thyroid: No thyromegaly.  Cardiovascular:     Rate and Rhythm: Normal rate and regular rhythm.     Heart sounds: Normal heart sounds. No murmur.  Pulmonary:     Effort: Pulmonary effort is normal. No respiratory distress.     Breath sounds: Normal breath sounds. No wheezing or rales.  Genitourinary:    Comments: Rectal/DRE: Normal external exam without hemorrhoids fissures or abnormality. DRE with palpation of mild to moderately enlarged prostate smooth symmetrical without  nodule or tenderness. Musculoskeletal: Normal range of motion.     Comments: Uses cane for ambulation  Lymphadenopathy:     Cervical: No cervical adenopathy.  Skin:    General: Skin is warm and dry.     Findings: No erythema or rash.  Neurological:     Mental Status: He is alert. Mental status is at baseline.  Psychiatric:        Behavior: Behavior normal.     Comments: Well groomed, good eye contact, normal speech and thoughts    Results for orders placed or performed in visit on 11/15/19  [facility] - Hemoglobin A1c POCT (Hb A1C)  Result Value Ref Range   Hemoglobin A1C 6.1 (A) 4.0 - 5.6 %  [facility] - POCT UA urinalysis dipstick  Result Value Ref Range   Color, UA Straw    Clarity, UA Clear    Glucose, UA Negative Negative   Bilirubin, UA Negative    Ketones, UA Negative    Spec Grav, UA 1.020 1.010 - 1.025   Blood, UA Negative    pH, UA 5.0 5.0 - 8.0   Protein, UA Positive (A) Negative   Urobilinogen, UA 0.2 0.2 or 1.0 E.U./dL   Nitrite, UA Negative    Leukocytes, UA Trace (A) Negative   Appearance Clear    Odor No odor     Recent Labs    11/15/19 1020  HGBA1C 6.1*       Assessment & Plan:   Problem List Items Addressed This Visit    Pre-diabetes    Improved A1c down to 6.1, from prior 6.4 Reassuring seems still in PreDM range but improved significantly Less likely to be cause of nocturia / urinary frequency with improved sugar Encourage continue healthy balanced diet No medication or other treatment required.      Relevant Orders   [facility] - Hemoglobin A1c POCT (Hb A1C) (Completed)   BPH associated with nocturia - Primary    History and exam consistent today with BPH with nocturia / urinary frequency LUTS - AUA BPH score 11 today - No prior prostate meds before - Last DRE done today with mild to moderate enlargement of prostate without other abnormality - No known personal/family history of prostate CA  Plan: As requested to proceed now - will start Tamsulosin  0.4mg  daily, advised on benefits, risks, if BP low caution with sudden standing up or position change  Follow-up symptom relief on alpha blocker, future can consider other med options or urology if indicated for OAB vs other prostate BPH meds      Relevant Medications   tamsulosin (FLOMAX) 0.4 MG CAPS  capsule    Other Visit Diagnoses    Nocturia       Relevant Orders   [place] - POCT UA urinalysis dipstick (Completed)   [place] - Urine culture   Urinary frequency          Additionally - Urinalysis dipstick showed some small leuks and protein, cannot rule out UTI  - checked Urine Culture today as well to rule out or diagnose possible UTI (less likely clinically) - If negative urine culture, no antibiotics required - if positive would notify patient/caregiver and send appropriate antibiotics  Meds ordered this encounter  Medications  . tamsulosin (FLOMAX) 0.4 MG CAPS capsule    Sig: Take 1 capsule (0.4 mg total) by mouth daily after breakfast.    Dispense:  30 capsule    Refill:  2    Follow up plan: Return if symptoms worsen or fail to improve.   [name], DO [facility] 11/15/2019, 10:32 AM

## 2019-11-15 NOTE — Patient Instructions (Addendum)
Thank you for coming to the office today.  Recent Labs    11/15/19 1020  HGBA1C 6.1*    A1c is 6.1 this is improved from last few results over past 4 years, was 6.2 to 6.4  It is still in Pre-Diabetes range, above 5.7 but this is improved, I don't think this is causing all of the urinary frequency.  Urine test shows mild white blood cells or a few abnormality but it is not entirely consistent with a UTI infection, we will send it to lab for CULTURE and stay tuned for those results within 48 hours, if it shows bacteria we can treat it with antibiotic, but at this point I am not completely convinced for infection.  Lastly, I do think the most common explanation is going to be enlarged prostate - on prostate exam today in office, he has a mild to moderately enlarged prostate, this is not always the cause of these urinary symptoms but it is supportive of that diagnosis  We can consider Tamsulosin (Flomax) the most common enlarged prostate medicine to relax muscles around prostate and help urination, this would reduce urinary frequency - CAUTIOn with this med is to avoid sudden standing, it can cause dizziness at times, especially in elderly patient  This could be taken once nightly OR daily to help symptoms, we can discuss soon in [place] or phone call.  Otherwise last option would be over active bladder  Please schedule a Follow-up Appointment to: Return if symptoms worsen or fail to improve.  If you have any other questions or concerns, please feel free to call the office or send a message through [place]. You may also schedule an earlier appointment if necessary.  Additionally, you may be receiving a survey about your experience at our office within a few days to 1 week by e-mail or mail. We value your feedback.  [name], DO [place]

## 2019-11-17 LAB — URINE CULTURE
MICRO NUMBER:: 1109603
Result:: NO GROWTH
SPECIMEN QUALITY:: ADEQUATE

## 2019-11-18 ENCOUNTER — Telehealth: Payer: Self-pay | Admitting: Neurology

## 2019-11-18 MED ORDER — MEMANTINE HCL 5 MG PO TABS: ORAL_TABLET | ORAL | 5 refills | Status: DC

## 2019-11-18 NOTE — Telephone Encounter (Signed)
Patient daughter called and states that her dad is doing great on the namenda and would like a RX sent to the [facility] on [address] in [place]

## 2019-11-18 NOTE — Telephone Encounter (Signed)
Message sent to daughter, we had discussed increasing Namenda to 5mg  BID if tolerated. If he has problems, go back to 5mg  daily. Rx send to [facility].

## 2019-11-30 ENCOUNTER — Telehealth: Payer: Self-pay | Admitting: Adult Health Nurse Practitioner

## 2019-11-30 NOTE — Telephone Encounter (Signed)
Called daughter Marlowe Kays, to schedule the Palliative Consult, no answer - message left with reason for call along with my contact information.

## 2019-12-12 ENCOUNTER — Telehealth: Payer: Self-pay | Admitting: Adult Health Nurse Practitioner

## 2019-12-12 NOTE — Telephone Encounter (Signed)
Called daughter to schedule the Palliative Consult, no answer - left message requesting a call back by the end of the week to let me know whether if they do or do not want to pursue Palliative services.  3:20 PM:  Rec'd a call back from daughter stating that she doesn't feel that they need Palliative services at this time, they now have 24 hour care for him and he hasn't had any significant decline.  Told daughter that we would cancel the referral for Palliative services and notify Dr. Parks Ranger and that if she should change her to to contact MD office and request Palliative services at that time and she was in agreement with this.

## 2019-12-14 ENCOUNTER — Other Ambulatory Visit: Payer: Self-pay | Admitting: Neurology

## 2019-12-14 MED ORDER — DIVALPROEX SODIUM 125 MG PO DR TAB: DELAYED_RELEASE_TABLET | ORAL | 5 refills | Status: DC

## 2019-12-28 DIAGNOSIS — H2513 Age-related nuclear cataract, bilateral: Secondary | ICD-10-CM | POA: Diagnosis not present

## 2019-12-28 DIAGNOSIS — H353211 Exudative age-related macular degeneration, right eye, with active choroidal neovascularization: Secondary | ICD-10-CM | POA: Diagnosis not present

## 2020-02-01 ENCOUNTER — Other Ambulatory Visit: Payer: Self-pay | Admitting: Neurology

## 2020-02-01 MED ORDER — DIVALPROEX SODIUM ER 250 MG PO TB24: ORAL_TABLET | ORAL | 3 refills | Status: DC

## 2020-02-01 MED ORDER — DIVALPROEX SODIUM ER 250 MG PO TB24: ORAL_TABLET | ORAL | 2 refills | Status: DC

## 2020-02-01 MED ORDER — DIVALPROEX SODIUM 125 MG PO DR TAB: DELAYED_RELEASE_TABLET | ORAL | 11 refills | Status: DC

## 2020-02-21 MED ORDER — MEMANTINE HCL 5 MG PO TABS: ORAL_TABLET | ORAL | 3 refills | Status: DC

## 2020-02-27 DIAGNOSIS — H6123 Impacted cerumen, bilateral: Secondary | ICD-10-CM | POA: Diagnosis not present

## 2020-02-27 DIAGNOSIS — H6063 Unspecified chronic otitis externa, bilateral: Secondary | ICD-10-CM | POA: Diagnosis not present

## 2020-02-27 DIAGNOSIS — H903 Sensorineural hearing loss, bilateral: Secondary | ICD-10-CM | POA: Diagnosis not present

## 2020-02-29 DIAGNOSIS — H353211 Exudative age-related macular degeneration, right eye, with active choroidal neovascularization: Secondary | ICD-10-CM | POA: Diagnosis not present

## 2020-03-09 ENCOUNTER — Ambulatory Visit: Payer: Medicare Other | Admitting: Neurology

## 2020-03-09 ENCOUNTER — Encounter: Payer: Self-pay | Admitting: Neurology

## 2020-03-09 ENCOUNTER — Other Ambulatory Visit: Payer: Self-pay

## 2020-03-09 VITALS — BP 119/68 | HR 66 | Ht 67.0 in | Wt 148.0 lb

## 2020-03-09 DIAGNOSIS — F0391 Unspecified dementia with behavioral disturbance: Secondary | ICD-10-CM | POA: Diagnosis not present

## 2020-03-09 DIAGNOSIS — F03B18 Unspecified dementia, moderate, with other behavioral disturbance: Secondary | ICD-10-CM

## 2020-03-09 MED ORDER — DIVALPROEX SODIUM ER 250 MG PO TB24: ORAL_TABLET | ORAL | 3 refills | Status: DC

## 2020-03-09 NOTE — Progress Notes (Signed)
NEUROLOGY FOLLOW UP OFFICE NOTE  MOSA HILLIER [id number] [birth year]  HISTORY OF PRESENT ILLNESS: I had the pleasure of seeing Travis Mccoy in follow-up in the neurology clinic on 03/09/2020.  The patient was last seen 7 months ago for dementia with behavioral disturbance. He is accompanied by his aide Bev who helps supplement the history today. His daughter Marlowe Kays has been corresponding with updates via [facility]. Since his last visit, he has been tolerating Memantine 5mg  BID, Lexapro 10mg  daily, and Depakote ER 250mg  daily without side effects. He has been having increasing agitation and hallucinations, obsessing about ladders on his back deck that he knew someone was going to steal. He gets up at 3am and gets dressed, ready to go. Bev usually gives the Depakote at noon, but sometimes needs to give it earlier at 10am. He has melatonin at around 5pm and is ready for bed at 7pm. He is up and down all night, he states he sleeps good. He denies any falls, Bev reports a fall within the past 6 months. Appetite is good. He denies any headaches, dizziness, focal numbness/tingling/weakness. He needs assistance with dressing and bathing, he ambulates with a cane.   I personally reviewed head CT without contrast done 07/2019 which did not show any acute changes, there was atrophy with extensive chronic microvascular disease.   Lab Results  Component Value Date   TSH 1.63 09/08/2019   Lab Results  Component Value Date   [id number] 09/08/2019     History on Initial Assessment 08/02/2019: This is a pleasant 86 year old right-handed man with a history of hypertension, hyperlipidemia, presenting for evaluation of dementia. He feels his memory is "pretty good, not real good but okay." His daughter started noticing personality and memory changes in the Fall of 2019, he was starting to get more obsessive, gradually worsening to the point where he would have one thing in his mind and would focus on it until family  did something about it. He got paranoid about people coming in the yard, now the garage door is closed all the time. He is usually very social with his neighbors but has been obsessive about one of his neighbors that he dislikes, he had Rise Paganini get airhorns to blow out him when out in the yard. He would hear something when they talk on the phone with him and ask if the phone is bugged. He has a gun at his bedside table and told family one time he heard someone and brought the gun to the door. He has also been misplacing things frequently, misplacing the garage door opener, remote control, his wallet. His daughter asked for his social security number and he gave his phone number. He has a harder time opening his tool box. He forgets how to use the microwave despite having written instructions. He lives alone and has an aide for the past year, Rise Paganini, who comes 6 hours a day, 5 days a week. She has been driving him around. She notes he repeats himself several times, and keeps asking until he gets the answer he is looking for. His daugter took over finances a year ago, he was doing a pretty good job but taking a longer time due to macular degeneration. He denies forgetting medications, family has noticed he would miss 3 nights out of the week in his pillbox. 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 He would be up with the lights on, with his cabinets open. He also had incontinence. Dose was reduced to 2.5mg  which worked better for him, his obsessiveness is noticeably better even on low dose, no further incontinence but he occasionally wears adult diapers. They tried to stop it but the obsessiveness recurred. He thinks his mood is pretty good. Sleep is okay. He is independent with dressing and bathing but bathes every 2-3 days and wears the same clothes 2-3 days in a row. His sister has memory  issues. He drinks 1-2 beers a week. No history of significant head injuries.   He denies any headaches, dizziness, diplopia, dysarthria, dysphagia, neck/back pain, focal numbness/tingling/weakness. No anosmia, tremors, no falls. He has occasional constipation.   Outpatient Encounter Medications as of 03/09/2020  Medication Sig  . aspirin 81 MG tablet Take 1 tablet (81 mg total) by mouth daily.  . divalproex (DEPAKOTE ER) 250 MG 24 hr tablet Take 1 tablet every afternoon.  . escitalopram (LEXAPRO) 10 MG tablet Take 1 tablet daily  . fluticasone (FLONASE) 50 MCG/ACT nasal spray Place 2 sprays into both nostrils daily.  . memantine (NAMENDA) 5 MG tablet Take 1 tablet twice a day  . Multiple Vitamins-Minerals (PRESERVISION AREDS 2+MULTI VIT PO) Take by mouth.  Marland Kitchen omeprazole (PRILOSEC) 20 MG capsule TAKE 1 CAPSULE BY MOUTH  DAILY  . Polyethyl Glycol-Propyl Glycol 0.4-0.3 % SOLN Apply to eye.  . pravastatin (PRAVACHOL) 20 MG tablet TAKE 1 TABLET BY MOUTH  EVERY OTHER EVENING  . tamsulosin (FLOMAX) 0.4 MG CAPS capsule Take 1 capsule (0.4 mg total) by mouth daily after breakfast.  .     No facility-administered encounter medications on file as of 03/09/2020.     PAST MEDICAL HISTORY: Past Medical History:  Diagnosis Date  . GERD (gastroesophageal reflux disease)   . Hyperlipidemia   . Hypertension     ALLERGIES: Allergies  Allergen Reactions  . Lisinopril Cough    FAMILY HISTORY: Family History  Problem Relation Age of Onset  . Stroke Father     SOCIAL HISTORY: Social History   Socioeconomic History  . Marital status: Widowed    Spouse name: Not on file  . Number of children: Not on file  . Years of education: Not on file  . Highest education level: 12th grade  Occupational History  . Not on file  Tobacco Use  . Smoking status: Former Smoker    Types: Cigarettes    Quit date: 08/30/1959    Years since quitting: 60.5  . Smokeless tobacco: Former Network engineer and Sexual  Activity  . Alcohol use: Not Currently  . Drug use: No  . Sexual activity: Not on file  Other Topics Concern  . Not on file  Social History Narrative   Lives alone      One story home      Completed 12th grade      Walkes with cane      Right handed   Social Determinants of Health   Financial Resource Strain:   . Difficulty of Paying Living Expenses:   Food Insecurity:   . Worried About Charity fundraiser in the Last Year:   . Arboriculturist in the Last Year:   Transportation Needs:   . Film/video editor (Medical):   Marland Kitchen Lack of Transportation (Non-Medical):   Physical Activity:   . Days of Exercise per Week:   . Minutes of  Exercise per Session:   Stress:   . Feeling of Stress :   Social Connections:   . Frequency of Communication with Friends and Family:   . Frequency of Social Gatherings with Friends and Family:   . Attends Religious Services:   . Active Member of Clubs or Organizations:   . Attends Archivist Meetings:   Marland Kitchen Marital Status:   Intimate Partner Violence:   . Fear of Current or Ex-Partner:   . Emotionally Abused:   Marland Kitchen Physically Abused:   . Sexually Abused:     PHYSICAL EXAM: Vitals:   03/09/20 1534  BP: 119/68  Pulse: 66  SpO2: 100%   General: No acute distress Head:  Normocephalic/atraumatic Skin/Extremities: No rash, no edema Neurological Exam: alert and oriented to person, state. No aphasia or dysarthria. Fund of knowledge is reduced. Recent and remote memory are impaired. Attention and concentration are reduced. Able to name objects and repeat phrases.  MMSE - Mini Mental State Exam 03/09/2020 06/08/2019 01/29/2016  Orientation to time 0 0 5  Orientation to Place 1 5 5   Registration 3 3 3   Attention/ Calculation 3 5 5   Recall 0 0 0  Language- name 2 objects 1 2 2   Language- repeat 0 1 1  Language- follow 3 step command 3 3 3   Language- read & follow direction 1 1 1   Write a sentence 1 1 1   Copy design 0 1 1  Total  score 13 22 27    Cranial nerves: Pupils equal, round, reactive to light.  Extraocular movements intact with no nystagmus. Visual fields full. No facial asymmetry. Motor: Bulk and tone normal, muscle strength 5/5 throughout with no pronator drift. Finger to nose testing intact.  Gait slow and cautious with cane  IMPRESSION: This is a pleasant 86 yo RH man with a history of hypertension, hyperlipidemia, with likely Alzheimer's disease with behavioral disturbance. Head CT showed diffuse atrophy with extensive chronic microvascular disease. He is tolerating Memantine 5mg  BID. He is having more behavioral changes, hallucinations, paranoia. We discussed that Depakote is a low dose, can give additional dose as needed. Consider increasing Lexapro to 20mg  daily, this will be discussed with his daughter. He does not drive. Continue 24/7 care. Follow-up in 6 months, they know to call for any changes.   Thank you for allowing me to participate in his care.  Please do not hesitate to call for any questions or concerns.   Ellouise Newer, M.D.   CC: Dr. Parks Ranger

## 2020-03-09 NOTE — Patient Instructions (Signed)
Good to see you! We can take a higher dose of Depakote, take 1 tablet daily, and may give additional dose if needed. I will discuss increasing Lexapro to 20mg  daily with Marlowe Kays. Continue 24/7 care. Follow-up in 6 months, call for any changes.

## 2020-03-12 ENCOUNTER — Other Ambulatory Visit: Payer: Self-pay | Admitting: Neurology

## 2020-03-12 MED ORDER — ESCITALOPRAM OXALATE 20 MG PO TABS: 20.0000 mg | ORAL_TABLET | Freq: Every day | ORAL | 3 refills | Status: AC

## 2020-03-13 ENCOUNTER — Ambulatory Visit: Payer: Medicare Other | Admitting: Family Medicine

## 2020-03-21 ENCOUNTER — Other Ambulatory Visit: Payer: Self-pay

## 2020-03-21 ENCOUNTER — Emergency Department: Payer: Medicare Other

## 2020-03-21 ENCOUNTER — Inpatient Hospital Stay
Admission: EM | Admit: 2020-03-21 | Discharge: 2020-03-24 | DRG: 481 | Disposition: A | Discharge status: Home Health | Payer: Medicare Other | Attending: Internal Medicine | Admitting: Internal Medicine

## 2020-03-21 DIAGNOSIS — I4892 Unspecified atrial flutter: Secondary | ICD-10-CM | POA: Diagnosis not present

## 2020-03-21 DIAGNOSIS — S72142A Displaced intertrochanteric fracture of left femur, initial encounter for closed fracture: Secondary | ICD-10-CM | POA: Diagnosis not present

## 2020-03-21 DIAGNOSIS — R0902 Hypoxemia: Secondary | ICD-10-CM | POA: Diagnosis not present

## 2020-03-21 DIAGNOSIS — Z20822 Contact with and (suspected) exposure to covid-19: Secondary | ICD-10-CM | POA: Diagnosis not present

## 2020-03-21 DIAGNOSIS — Z79890 Hormone replacement therapy (postmenopausal): Secondary | ICD-10-CM | POA: Diagnosis not present

## 2020-03-21 DIAGNOSIS — S72009A Fracture of unspecified part of neck of unspecified femur, initial encounter for closed fracture: Secondary | ICD-10-CM

## 2020-03-21 DIAGNOSIS — Z66 Do not resuscitate: Secondary | ICD-10-CM | POA: Diagnosis not present

## 2020-03-21 DIAGNOSIS — Z888 Allergy status to other drugs, medicaments and biological substances status: Secondary | ICD-10-CM | POA: Diagnosis not present

## 2020-03-21 DIAGNOSIS — Z79899 Other long term (current) drug therapy: Secondary | ICD-10-CM | POA: Diagnosis not present

## 2020-03-21 DIAGNOSIS — I959 Hypotension, unspecified: Secondary | ICD-10-CM | POA: Diagnosis not present

## 2020-03-21 DIAGNOSIS — E785 Hyperlipidemia, unspecified: Secondary | ICD-10-CM | POA: Diagnosis not present

## 2020-03-21 DIAGNOSIS — S72145A Nondisplaced intertrochanteric fracture of left femur, initial encounter for closed fracture: Secondary | ICD-10-CM | POA: Diagnosis not present

## 2020-03-21 DIAGNOSIS — Z85828 Personal history of other malignant neoplasm of skin: Secondary | ICD-10-CM

## 2020-03-21 DIAGNOSIS — G309 Alzheimer's disease, unspecified: Secondary | ICD-10-CM | POA: Diagnosis present

## 2020-03-21 DIAGNOSIS — F0150 Vascular dementia without behavioral disturbance: Secondary | ICD-10-CM | POA: Diagnosis present

## 2020-03-21 DIAGNOSIS — Z7982 Long term (current) use of aspirin: Secondary | ICD-10-CM | POA: Diagnosis not present

## 2020-03-21 DIAGNOSIS — W010XXA Fall on same level from slipping, tripping and stumbling without subsequent striking against object, initial encounter: Secondary | ICD-10-CM | POA: Diagnosis not present

## 2020-03-21 DIAGNOSIS — D649 Anemia, unspecified: Secondary | ICD-10-CM | POA: Diagnosis not present

## 2020-03-21 DIAGNOSIS — K219 Gastro-esophageal reflux disease without esophagitis: Secondary | ICD-10-CM | POA: Diagnosis not present

## 2020-03-21 DIAGNOSIS — F0280 Dementia in other diseases classified elsewhere without behavioral disturbance: Secondary | ICD-10-CM | POA: Diagnosis present

## 2020-03-21 DIAGNOSIS — Z87891 Personal history of nicotine dependence: Secondary | ICD-10-CM | POA: Diagnosis not present

## 2020-03-21 DIAGNOSIS — Z01810 Encounter for preprocedural cardiovascular examination: Secondary | ICD-10-CM | POA: Diagnosis not present

## 2020-03-21 DIAGNOSIS — Z743 Need for continuous supervision: Secondary | ICD-10-CM | POA: Diagnosis not present

## 2020-03-21 DIAGNOSIS — R52 Pain, unspecified: Secondary | ICD-10-CM | POA: Diagnosis not present

## 2020-03-21 DIAGNOSIS — Z419 Encounter for procedure for purposes other than remedying health state, unspecified: Secondary | ICD-10-CM

## 2020-03-21 DIAGNOSIS — N400 Enlarged prostate without lower urinary tract symptoms: Secondary | ICD-10-CM | POA: Diagnosis present

## 2020-03-21 DIAGNOSIS — I10 Essential (primary) hypertension: Secondary | ICD-10-CM | POA: Diagnosis not present

## 2020-03-21 DIAGNOSIS — M2550 Pain in unspecified joint: Secondary | ICD-10-CM | POA: Diagnosis not present

## 2020-03-21 DIAGNOSIS — R9431 Abnormal electrocardiogram [ECG] [EKG]: Secondary | ICD-10-CM | POA: Diagnosis not present

## 2020-03-21 DIAGNOSIS — S72002A Fracture of unspecified part of neck of left femur, initial encounter for closed fracture: Secondary | ICD-10-CM

## 2020-03-21 DIAGNOSIS — F0390 Unspecified dementia without behavioral disturbance: Secondary | ICD-10-CM | POA: Diagnosis not present

## 2020-03-21 DIAGNOSIS — Z03818 Encounter for observation for suspected exposure to other biological agents ruled out: Secondary | ICD-10-CM | POA: Diagnosis not present

## 2020-03-21 DIAGNOSIS — S299XXA Unspecified injury of thorax, initial encounter: Secondary | ICD-10-CM | POA: Diagnosis not present

## 2020-03-21 DIAGNOSIS — H9190 Unspecified hearing loss, unspecified ear: Secondary | ICD-10-CM | POA: Diagnosis not present

## 2020-03-21 DIAGNOSIS — M25559 Pain in unspecified hip: Secondary | ICD-10-CM

## 2020-03-21 DIAGNOSIS — W19XXXA Unspecified fall, initial encounter: Secondary | ICD-10-CM | POA: Diagnosis not present

## 2020-03-21 DIAGNOSIS — Z7401 Bed confinement status: Secondary | ICD-10-CM | POA: Diagnosis not present

## 2020-03-21 LAB — PROTIME-INR
INR: 1.1 (ref 0.8–1.2)
Prothrombin Time: 13.7 seconds (ref 11.4–15.2)

## 2020-03-21 LAB — COMPREHENSIVE METABOLIC PANEL
ALT: 19 U/L (ref 0–44)
AST: 31 U/L (ref 15–41)
Albumin: 3.5 g/dL (ref 3.5–5.0)
Alkaline Phosphatase: 68 U/L (ref 38–126)
Anion gap: 10 (ref 5–15)
BUN: 30 mg/dL — ABNORMAL HIGH (ref 8–23)
CO2: 25 mmol/L (ref 22–32)
Calcium: 9.1 mg/dL (ref 8.9–10.3)
Chloride: 104 mmol/L (ref 98–111)
Creatinine, Ser: 0.89 mg/dL (ref 0.61–1.24)
GFR calc Af Amer: >60 mL/min (ref >60)
GFR calc non Af Amer: >60 mL/min (ref >60)
Glucose, Bld: 107 mg/dL — ABNORMAL HIGH (ref 70–99)
Potassium: 4.5 mmol/L (ref 3.5–5.1)
Sodium: 139 mmol/L (ref 135–145)
Total Bilirubin: 0.8 mg/dL (ref 0.3–1.2)
Total Protein: 5.9 g/dL — ABNORMAL LOW (ref 6.5–8.1)

## 2020-03-21 LAB — CBC
HCT: 34.2 % — ABNORMAL LOW (ref 39.0–52.0)
Hemoglobin: 11.4 g/dL — ABNORMAL LOW (ref 13.0–17.0)
MCH: 32.7 pg (ref 26.0–34.0)
MCHC: 33.3 g/dL (ref 30.0–36.0)
MCV: 98 fL (ref 80.0–100.0)
Platelets: 168 10*3/uL (ref 150–400)
RBC: 3.49 MIL/uL — ABNORMAL LOW (ref 4.22–5.81)
RDW: 14 % (ref 11.5–15.5)
WBC: 6.3 10*3/uL (ref 4.0–10.5)
nRBC: 0 % (ref 0.0–0.2)

## 2020-03-21 LAB — APTT: aPTT: 28 seconds (ref 24–36)

## 2020-03-21 LAB — RESPIRATORY PANEL BY RT PCR (FLU A&B, COVID)
Influenza A by PCR: NEGATIVE
Influenza B by PCR: NEGATIVE
SARS Coronavirus 2 by RT PCR: NEGATIVE

## 2020-03-21 MED ORDER — POLYETHYLENE GLYCOL 3350 17 G PO PACK: 17.0000 g | PACK | Freq: Every day | ORAL | Status: DC | PRN

## 2020-03-21 MED ORDER — MORPHINE SULFATE (PF) 2 MG/ML IV SOLN
INTRAVENOUS | Status: AC
  Filled 2020-03-21: qty 1

## 2020-03-21 MED ORDER — SODIUM CHLORIDE 0.9 % IV SOLN: INTRAVENOUS | Status: DC

## 2020-03-21 MED ORDER — BISACODYL 10 MG RE SUPP: 10.0000 mg | Freq: Every day | RECTAL | Status: DC | PRN

## 2020-03-21 MED ORDER — SENNA 8.6 MG PO TABS: 1.0000 | ORAL_TABLET | Freq: Two times a day (BID) | ORAL | Status: DC

## 2020-03-21 MED ORDER — CEFAZOLIN SODIUM-DEXTROSE 2-4 GM/100ML-% IV SOLN
2.0000 g | INTRAVENOUS | Status: AC
  Administered 2020-03-22: 2 g via INTRAVENOUS
  Filled 2020-03-21: qty 100

## 2020-03-21 MED ORDER — FENTANYL CITRATE (PF) 100 MCG/2ML IJ SOLN
75.0000 ug | Freq: Once | INTRAMUSCULAR | Status: AC
  Administered 2020-03-21: 75 ug via INTRAVENOUS
  Filled 2020-03-21: qty 2

## 2020-03-21 MED ORDER — MORPHINE SULFATE (PF) 2 MG/ML IV SOLN
0.5000 mg | INTRAVENOUS | Status: DC | PRN
  Administered 2020-03-21: 0.5 mg via INTRAVENOUS

## 2020-03-21 MED ORDER — HYDROCODONE-ACETAMINOPHEN 5-325 MG PO TABS
1.0000 | ORAL_TABLET | Freq: Four times a day (QID) | ORAL | Status: DC | PRN
  Administered 2020-03-22: 2 via ORAL
  Administered 2020-03-22: 1 via ORAL
  Filled 2020-03-21: qty 2
  Filled 2020-03-21: qty 1

## 2020-03-21 NOTE — ED Provider Notes (Signed)
Hip x-ray is consistent with left hip fracture. Discussed finding with patient and family. Discussed with Dr. Mack Guise with orthopedic surgery. Will admit to hospitalist service.    Nance Pear, MD 03/21/20 [phone]

## 2020-03-21 NOTE — H&P (Addendum)
[facility] [place] NAME: Travis Mccoy    MR#:  [id number]  DATE OF BIRTH:  1922/06/02  DATE OF ADMISSION:  03/21/2020  PRIMARY CARE PHYSICIAN: Olin Hauser, DO   REQUESTING/REFERRING PHYSICIAN: Dr. Archie Balboa  Patient coming from : home. Patient has 24 x 7 care at home. Ambulates using a cane/walker.   CHIEF COMPLAINT:   Left hip pain status post post mechanical fall today HISTORY OF PRESENT ILLNESS:  Travis Mccoy  is a 86 y.o. male with a known history of Alzheimer's dementia, hyperlipidemia, hypertension comes to the emergency room after patient had a mechanical fall while getting out of the car fell backwards into the car and started having left hip pain.  ED course: the ER patient is hemodynamically stable. Complains of left hip pain. Found to have proximal left intertrochanteric femur fracture. ER physician has discussed with Dr. Mack Guise to see patient.  Patient denies any chest pain, shortness of breath. His only complaint is left hip pain. He is otherwise very active. No cardiac history per daughter (spoke on the phone) patient received IV fentanyl once in the ER.  Chest x-ray no acute cardiopulmonary abnormality. EKG showed atrial flutter. No old EKG for comparison.   PAST MEDICAL HISTORY:   Past Medical History:  Diagnosis Date  . GERD (gastroesophageal reflux disease)   . Hyperlipidemia   . Hypertension     PAST SURGICAL HISTOIRY:   Past Surgical History:  Procedure Laterality Date  . HERNIA REPAIR  2009  . SKIN CANCER EXCISION Right 10/18/2018   ear    SOCIAL HISTORY:   Social History   Tobacco Use  . Smoking status: Former Smoker    Types: Cigarettes    Quit date: 08/30/1959    Years since quitting: 60.6  . Smokeless tobacco: Former Network engineer Use Topics  . Alcohol use: Not Currently    FAMILY HISTORY:   Family History  Problem Relation Age of Onset  . Stroke Father     DRUG  ALLERGIES:   Allergies  Allergen Reactions  . Lisinopril Cough    REVIEW OF SYSTEMS:  Review of Systems  Constitutional: Negative for chills, fever and weight loss.  HENT: Negative for ear discharge, ear pain and nosebleeds.   Eyes: Negative for blurred vision, pain and discharge.  Respiratory: Negative for sputum production, shortness of breath, wheezing and stridor.   Cardiovascular: Negative for chest pain, palpitations, orthopnea and PND.  Gastrointestinal: Negative for abdominal pain, diarrhea, nausea and vomiting.  Genitourinary: Negative for frequency and urgency.  Musculoskeletal: Positive for joint pain. Negative for back pain.  Neurological: Negative for sensory change, speech change, focal weakness and weakness.  Psychiatric/Behavioral: Positive for memory loss. Negative for depression and hallucinations. The patient is not nervous/anxious.      MEDICATIONS AT HOME:  med rec needs to be completed-- waiting for family to bring the med list                                                                                    VITAL SIGNS:  Blood pressure (!) 113/53, pulse 70, temperature 97.7 F (36.5 C),  temperature source Oral, resp. rate 18, height 5\' 7"  (1.702 m), weight 67 kg, SpO2 98 %.  PHYSICAL EXAMINATION:  GENERAL:  86 y.o.-year-old patient lying in the bed with no acute distress. Appears jovial EYES: Pupils equal, round, reactive to light and accommodation. No scleral icterus.  HEENT: Head atraumatic, normocephalic. Oropharynx and nasopharynx clear.  NECK:  Supple, no jugular venous distention. No thyroid enlargement, no tenderness.  LUNGS: Normal breath sounds bilaterally, no wheezing, rales,rhonchi or crepitation. No use of accessory muscles of respiration.  CARDIOVASCULAR: S1, S2 normal. No murmurs, rubs, or gallops.  ABDOMEN: Soft, nontender, nondistended. Bowel sounds present. No organomegaly or mass.  EXTREMITIES: No pedal edema, cyanosis, or  clubbing. Left lower extremity shortened NEUROLOGIC: moves all extremities well, nonfocal PSYCHIATRIC: The patient is alert and awake SKIN: No obvious rash, lesion, or ulcer.   LABORATORY PANEL:   CBC Recent Labs  Lab 03/21/20 1435  WBC 6.3  HGB 11.4*  HCT 34.2*  PLT 168   ------------------------------------------------------------------------------------------------------------------  Chemistries  Recent Labs  Lab 03/21/20 1435  NA 139  K 4.5  CL 104  CO2 25  GLUCOSE 107*  BUN 30*  CREATININE 0.89  CALCIUM 9.1  AST 31  ALT 19  ALKPHOS 68  BILITOT 0.8   ------------------------------------------------------------------------------------------------------------------  Cardiac Enzymes No results for input(s): TROPONINI in the last 168 hours. ------------------------------------------------------------------------------------------------------------------  RADIOLOGY:  DG Chest 1 View  Result Date: 03/21/2020 CLINICAL DATA:  Pt arrives via [facility] from home after attempting to get out of the car and falling back into the car. PT has shortening of left leg and c/o bottom pain on left side. Pt unable to bear weight on the left leg. Left leg is foreshortened; EXAM: CHEST  1 VIEW COMPARISON:  03/14/2014 FINDINGS: Cardiac silhouette is normal in size. No mediastinal or hilar masses. No evidence of adenopathy. Mild linear scarring or atelectasis the lung bases, greater on the left. Lungs are otherwise clear. No convincing pleural effusion and no pneumothorax. Skeletal structures are demineralized but grossly intact. IMPRESSION: No active disease. Electronically Signed   By: Lajean Manes M.D.   On: 03/21/2020 15:37   DG HIP UNILAT WITH PELVIS 2-3 VIEWS LEFT  Result Date: 03/21/2020 CLINICAL DATA:  Pt arrives via [facility] from home after attempting to get out of the car and falling back into the car. PT has shortening of left leg and c/o bottom pain on left side. Pt unable to bear  weight on the left leg. Left leg is foreshortened; Pre op EXAM: DG HIP (WITH OR WITHOUT PELVIS) 2-3V LEFT COMPARISON:  None. FINDINGS: Intertrochanteric fracture of the proximal left femur. Primary fracture components are well aligned with minimal displacement and no significant angulation. There are separate fracture components of the lesser and greater trochanters. No other fractures.  No bone lesions. Hip joints, SI joints and symphysis pubis are normally aligned. Skeletal structures are diffusely demineralized. IMPRESSION: 1. Comminuted intertrochanteric fracture of the proximal left femur with no significant displacement or angulation. Electronically Signed   By: Lajean Manes M.D.   On: 03/21/2020 15:38    EKG:    IMPRESSION AND PLAN:  Ashwath Chau  is a 86 y.o. male with a known history of Alzheimer's dementia, hyperlipidemia, hypertension comes to the emergency room after patient had a mechanical fall while getting out of the car fell backwards into the car and started having left hip pain.  Acute left proximal intertrochanteric femur fracture status post mechanical fall -admit to orthopedic floor -DR. Mack Guise  to see patient -patient is at a low to intermediate risk for surgery -PRN IV and PO pain meds -PT//OT after surgery -DVT prophylaxis after surgery -IV fluids  Abnormal EKG-- atrial flutter -patient asymptomatic -no cardiac history--- this was confirmed with patient's daughter Meta Hatchet -will continue to monitor on telemetry -Dr Humphrey Rolls consulted to see pt--called and spoke with him -no old EKG for comparison  Hypertension will resume home meds once list available  Dementia-- chronic resume Namenda, depakote and lexapro patient has 24x7 caregivers at home.  Hyperlipidemia takes statins at home  BPH on Flomax  Jerrye Bushy continue PPI   Family Communication : spoke with daughter Meta Hatchet on the phone. She understands the risk and benefits for surgery. Consults :  orthopedic dr Mack Guise Code Status : DNR prior to admission-- confirmed with healthcare power of attorney daughter Marlowe Kays DVT prophylaxis : SCD  TOTAL TIME TAKING CARE OF THIS PATIENT: *50 minutes.    Fritzi Mandes M.D  [facility]     CC: Primary care physician; Olin Hauser, DO

## 2020-03-21 NOTE — ED Notes (Signed)
Report given to June, RN

## 2020-03-21 NOTE — ED Triage Notes (Addendum)
Pt arrives via [facility] from home after attempting to get out of the car and falling back into the car. PT has shortening of left leg and c/o bottom pain on left side. Pt unable to bear weight on the left leg. Pt in NAD and is A&Ox4. No other obvious deformities noted. Pt does not take blood thinners

## 2020-03-21 NOTE — Consult Note (Signed)
ORTHOPAEDIC CONSULTATION  REQUESTING PHYSICIAN: Fritzi Mandes, MD  Chief Complaint: Left hip pain status post fall  HPI: Travis Mccoy is a 86 y.o. male who developed the acute onset of left hip pain after a fall while trying to get out of a car.  Patient fell backwards injuring his left hip.  Patient was brought to [facility] emergency department where x-rays confirmed a comminuted left intertrochanteric hip fracture.  Orthopedics is consulted for management of the patient's fracture.  He is being admitted to the hospital service for preop evaluation and clearance.  Patient is seen at the bedside.  He complains of left hip pain with movement of the left hip, but is not having significant pain at rest.  He denies other injuries.  Past Medical History:  Diagnosis Date  . GERD (gastroesophageal reflux disease)   . Hyperlipidemia   . Hypertension    Past Surgical History:  Procedure Laterality Date  . HERNIA REPAIR  2009  . SKIN CANCER EXCISION Right 10/18/2018   ear   Social History   Socioeconomic History  . Marital status: Widowed    Spouse name: Not on file  . Number of children: Not on file  . Years of education: Not on file  . Highest education level: 12th grade  Occupational History  . Not on file  Tobacco Use  . Smoking status: Former Smoker    Types: Cigarettes    Quit date: 08/30/1959    Years since quitting: 60.6  . Smokeless tobacco: Former Network engineer and Sexual Activity  . Alcohol use: Not Currently  . Drug use: No  . Sexual activity: Not on file  Other Topics Concern  . Not on file  Social History Narrative   Lives alone      One story home      Completed 12th grade      Walkes with cane      Right handed   Social Determinants of Health   Financial Resource Strain:   . Difficulty of Paying Living Expenses:   Food Insecurity:   . Worried About Charity fundraiser in the Last Year:   . Arboriculturist in the Last Year:   Transportation  Needs:   . Film/video editor (Medical):   Marland Kitchen Lack of Transportation (Non-Medical):   Physical Activity:   . Days of Exercise per Week:   . Minutes of Exercise per Session:   Stress:   . Feeling of Stress :   Social Connections:   . Frequency of Communication with Friends and Family:   . Frequency of Social Gatherings with Friends and Family:   . Attends Religious Services:   . Active Member of Clubs or Organizations:   . Attends Archivist Meetings:   Marland Kitchen Marital Status:    Family History  Problem Relation Age of Onset  . Stroke Father    Allergies  Allergen Reactions  . Lisinopril Cough   Prior to Admission medications   Medication Sig Start Date End Date Taking? Authorizing Provider  aspirin 81 MG tablet Take 1 tablet (81 mg total) by mouth daily. 05/13/18  Yes Karamalegos, Devonne Doughty, DO  divalproex (DEPAKOTE ER) 250 MG 24 hr tablet Take 1 tablet every afternoon. May give additional dose as needed for agitation 03/09/20  Yes Cameron Sprang, MD  escitalopram (LEXAPRO) 20 MG tablet Take 1 tablet (20 mg total) by mouth daily. 03/12/20  Yes Cameron Sprang, MD  Melatonin 10  MG TABS Take 10 mg by mouth at bedtime.   Yes [provider]  memantine (NAMENDA) 5 MG tablet Take 1 tablet twice a day 02/21/20  Yes Cameron Sprang, MD  Multiple Vitamin (MULTIVITAMIN WITH MINERALS) TABS tablet Take 1 tablet by mouth daily.   Yes [provider]  Multiple Vitamins-Minerals (PRESERVISION AREDS 2+MULTI VIT PO) Take 2 tablets by mouth daily.    Yes [provider]  omeprazole (PRILOSEC) 20 MG capsule TAKE 1 CAPSULE BY MOUTH  DAILY 11/16/19  Yes Karamalegos, Devonne Doughty, DO  pravastatin (PRAVACHOL) 20 MG tablet TAKE 1 TABLET BY MOUTH  EVERY OTHER EVENING 11/16/19  Yes Olin Hauser, DO   DG Chest 1 View  Result Date: 03/21/2020 CLINICAL DATA:  Pt arrives via [facility] from home after attempting to get out of the car and falling back into the car. PT  has shortening of left leg and c/o bottom pain on left side. Pt unable to bear weight on the left leg. Left leg is foreshortened; EXAM: CHEST  1 VIEW COMPARISON:  03/14/2014 FINDINGS: Cardiac silhouette is normal in size. No mediastinal or hilar masses. No evidence of adenopathy. Mild linear scarring or atelectasis the lung bases, greater on the left. Lungs are otherwise clear. No convincing pleural effusion and no pneumothorax. Skeletal structures are demineralized but grossly intact. IMPRESSION: No active disease. Electronically Signed   By: Lajean Manes M.D.   On: 03/21/2020 15:37   DG HIP UNILAT WITH PELVIS 2-3 VIEWS LEFT  Result Date: 03/21/2020 CLINICAL DATA:  Pt arrives via [facility] from home after attempting to get out of the car and falling back into the car. PT has shortening of left leg and c/o bottom pain on left side. Pt unable to bear weight on the left leg. Left leg is foreshortened; Pre op EXAM: DG HIP (WITH OR WITHOUT PELVIS) 2-3V LEFT COMPARISON:  None. FINDINGS: Intertrochanteric fracture of the proximal left femur. Primary fracture components are well aligned with minimal displacement and no significant angulation. There are separate fracture components of the lesser and greater trochanters. No other fractures.  No bone lesions. Hip joints, SI joints and symphysis pubis are normally aligned. Skeletal structures are diffusely demineralized. IMPRESSION: 1. Comminuted intertrochanteric fracture of the proximal left femur with no significant displacement or angulation. Electronically Signed   By: Lajean Manes M.D.   On: 03/21/2020 15:38    Positive ROS: All other systems have been reviewed and were otherwise negative with the exception of those mentioned in the HPI and as above.  Physical Exam: General: Alert, no acute distress  MUSCULOSKELETAL: Left lower extremity: The skin overlying the left hip is intact.  There is no erythema ecchymosis or significant swelling.  His thigh compartments  are soft and compressible.  He has shortening and external rotation of the left lower extremity.  He has palpable pedal pulses, intact/light touch and intact motor function distally.  Assessment: Left comminuted intertrochanteric hip fracture  Plan: I spoke with the patient and his daughter, Sheryle Hail, by phone as she is recovering from Covid.  I informed both of them that he has a left hip fracture and have recommended intramedullary fixation for this fracture.  I have discussed the details of the operation as well as the postoperative course. I discussed the risks and benefits of surgery. They understand risks include but are not limited to infection, bleeding requiring blood transfusion, nerve or blood vessel injury, joint stiffness or loss of motion, persistent pain, weakness  or instability, malunion, nonunion and hardware failure and the need for further surgery. Medical risks include but are not limited to DVT and pulmonary embolism, myocardial infarction, stroke, pneumonia, respiratory failure and death. Patient and his daughter understood these risks and wished to proceed.   I discussed this case with Dr. Gayland Curry.  Patient's EKG showed atrial flutter.  Patient will have a cardiology consult in the morning.  I reviewed the x-rays and lab work on this patient in preparation for the case.  Patient will be n.p.o. after midnight.  He should not receive any anticoagulation medication tonight in preparation for surgery tomorrow.     Thornton Park, MD    03/21/2020 7:38 PM

## 2020-03-21 NOTE — ED Provider Notes (Signed)
[facility] Emergency Department Provider Note  Time seen: 2:27 PM  I have reviewed the triage vital signs and the nursing notes.   HISTORY  Chief Complaint Hip Pain   HPI Travis Mccoy is a 86 y.o. male with a past medical history of hypertension, hyperlipidemia not currently on any medications per patient presents to the emergency department with left hip pain after a fall.  According to the patient he slipped and fell onto his left side was unable to get up due to left hip pain.  Externally rotated left lower extremity.  Denies hitting his head.  Denies any other injuries.   Past Medical History:  Diagnosis Date  . GERD (gastroesophageal reflux disease)   . Hyperlipidemia   . Hypertension     Patient Active Problem List   Diagnosis Date Noted  . BPH associated with nocturia 11/15/2019  . Hearing loss 10/03/2019  . Reduced vision 10/03/2019  . Vascular dementia without behavioral disturbance ([place]) 06/09/2019  . Actinic keratoses 04/14/2017  . Pre-diabetes 06/25/2016  . Seasonal allergies 10/03/2015  . Hypertension 07/17/2015  . Hyperlipidemia 07/17/2015  . GERD (gastroesophageal reflux disease) 07/17/2015    Past Surgical History:  Procedure Laterality Date  . HERNIA REPAIR  2009  . SKIN CANCER EXCISION Right 10/18/2018   ear    Prior to Admission medications   Medication Sig Start Date End Date Taking? Authorizing Provider  aspirin 81 MG tablet Take 1 tablet (81 mg total) by mouth daily. 05/13/18   Karamalegos, Devonne Doughty, DO  divalproex (DEPAKOTE ER) 250 MG 24 hr tablet Take 1 tablet every afternoon. May give additional dose as needed for agitation 03/09/20   Cameron Sprang, MD  escitalopram (LEXAPRO) 20 MG tablet Take 1 tablet (20 mg total) by mouth daily. 03/12/20   Cameron Sprang, MD  fluticasone [facility]) 50 MCG/ACT nasal spray Place 2 sprays into both nostrils daily. 06/25/16   Arlis Porta., MD  memantine [facility]) 5 MG tablet  Take 1 tablet twice a day 02/21/20   Cameron Sprang, MD  Multiple Vitamins-Minerals (PRESERVISION AREDS 2+MULTI VIT PO) Take by mouth.    [provider]  omeprazole (PRILOSEC) 20 MG capsule TAKE 1 CAPSULE BY MOUTH  DAILY 11/16/19   Parks Ranger, Devonne Doughty, DO  Polyethyl Glycol-Propyl Glycol 0.4-0.3 % SOLN Apply to eye.    [provider]  pravastatin (PRAVACHOL) 20 MG tablet TAKE 1 TABLET BY MOUTH  EVERY OTHER EVENING 11/16/19   Parks Ranger, Devonne Doughty, DO  tamsulosin (FLOMAX) 0.4 MG CAPS capsule Take 1 capsule (0.4 mg total) by mouth daily after breakfast. 11/15/19   Olin Hauser, DO    Allergies  Allergen Reactions  . Lisinopril Cough    Family History  Problem Relation Age of Onset  . Stroke Father     Social History Social History   Tobacco Use  . Smoking status: Former Smoker    Types: Cigarettes    Quit date: 08/30/1959    Years since quitting: 60.6  . Smokeless tobacco: Former Network engineer Use Topics  . Alcohol use: Not Currently  . Drug use: No    Review of Systems Constitutional: Negative for LOC. Cardiovascular: Negative for chest pain. Respiratory: Negative for shortness of breath. Gastrointestinal: Negative for abdominal pain Musculoskeletal: Positive for left hip pain, moderate aching pain worse with any type of movement Neurological: Negative for headache All other ROS negative  ____________________________________________   PHYSICAL EXAM:  VITAL SIGNS: ED  Triage Vitals  Enc Vitals Group     BP 03/21/20 1424 118/60     Pulse Rate 03/21/20 1424 72     Resp 03/21/20 1424 18     Temp 03/21/20 1424 97.7 F (36.5 C)     Temp Source 03/21/20 1424 Oral     SpO2 03/21/20 1424 98 %     Weight 03/21/20 1421 147 lb 11.3 oz (67 kg)     Height 03/21/20 1421 5\' 7"  (1.702 m)     Head Circumference --      Peak Flow --      Pain Score 03/21/20 1421 0     Pain Loc --      Pain Edu? --      Excl. in [place]? --      Constitutional: Alert and oriented. Well appearing and in no distress. Eyes: Normal exam ENT      Head: Normocephalic and atraumatic.      Mouth/Throat: Mucous membranes are moist. Cardiovascular: Normal rate, regular rhythm. Respiratory: Normal respiratory effort without tachypnea nor retractions. Breath sounds are clear  Gastrointestinal: Soft and nontender. No distention.  Musculoskeletal: Shortened and externally rotated left lower extremity.  Neurovascular intact distally.  Moderate left hip tenderness to palpation, significant pain with any attempted range of motion. Neurologic:  Normal speech and language. No gross focal neurologic deficits Skin:  Skin is warm, dry and intact.  Psychiatric: Mood and affect are normal.   ____________________________________________    EKG  EKG viewed and interpreted by myself shows what appears to be a flutter at 71 bpm with a widened QRS, left axis deviation, slight QTC prolongation.  ____________________________________________    RADIOLOGY  Left intertrochanteric fracture  ____________________________________________   INITIAL IMPRESSION / ASSESSMENT AND PLAN / ED COURSE  Pertinent labs & imaging results that were available during my care of the patient were reviewed by me and considered in my medical decision making (see chart for details).   Patient presents to the emergency department via EMS from home for a fall with left hip pain.  Has a shortened and externally rotated left lower extremity with moderate left hip tenderness to palpation worse with range of motion.  Highly suspect left hip fracture.  We will check labs, chest x-ray, EKG and images of the hip.  I anticipate likely admission to the hospital with orthopedic consultation.  Travis Mccoy was evaluated in Emergency Department on 03/21/2020 for the symptoms described in the history of present illness. He was evaluated in the context of the global COVID-19 pandemic,  which necessitated consideration that the patient might be at risk for infection with the SARS-CoV-2 virus that causes COVID-19. Institutional protocols and algorithms that pertain to the evaluation of patients at risk for COVID-19 are in a state of rapid change based on information released by regulatory bodies including the CDC and federal and state organizations. These policies and algorithms were followed during the patient's care in the ED.  ____________________________________________   FINAL CLINICAL IMPRESSION(S) / ED DIAGNOSES  Left hip fracture   Harvest Dark, MD 03/23/20 2252

## 2020-03-22 ENCOUNTER — Inpatient Hospital Stay: Payer: Medicare Other | Admitting: Anesthesiology

## 2020-03-22 ENCOUNTER — Inpatient Hospital Stay: Payer: Medicare Other

## 2020-03-22 ENCOUNTER — Encounter: Payer: Self-pay | Admitting: Internal Medicine

## 2020-03-22 ENCOUNTER — Inpatient Hospital Stay
Admit: 2020-03-22 | Discharge: 2020-03-22 | Disposition: A | Discharge status: Home / Self Care | Payer: Medicare Other | Attending: Internal Medicine | Admitting: Internal Medicine

## 2020-03-22 ENCOUNTER — Encounter
Admission: EM | Disposition: A | Discharge status: Home Health | Payer: Self-pay | Source: Home / Self Care | Attending: Internal Medicine

## 2020-03-22 DIAGNOSIS — F0280 Dementia in other diseases classified elsewhere without behavioral disturbance: Secondary | ICD-10-CM

## 2020-03-22 DIAGNOSIS — E785 Hyperlipidemia, unspecified: Secondary | ICD-10-CM

## 2020-03-22 DIAGNOSIS — G309 Alzheimer's disease, unspecified: Secondary | ICD-10-CM

## 2020-03-22 HISTORY — PX: INTRAMEDULLARY (IM) NAIL INTERTROCHANTERIC: SHX5875

## 2020-03-22 SURGERY — FIXATION, FRACTURE, INTERTROCHANTERIC, WITH INTRAMEDULLARY ROD
Anesthesia: Spinal | Laterality: Left

## 2020-03-22 MED ORDER — ONDANSETRON HCL 4 MG/2ML IJ SOLN: 4.0000 mg | Freq: Four times a day (QID) | INTRAMUSCULAR | Status: DC | PRN

## 2020-03-22 MED ORDER — MEMANTINE HCL 5 MG PO TABS
5.0000 mg | ORAL_TABLET | Freq: Every day | ORAL | Status: DC
  Administered 2020-03-23: 5 mg via ORAL
  Filled 2020-03-22 (×2): qty 1

## 2020-03-22 MED ORDER — HYDROCODONE-ACETAMINOPHEN 5-325 MG PO TABS: 1.0000 | ORAL_TABLET | ORAL | Status: DC | PRN

## 2020-03-22 MED ORDER — ALBUMIN HUMAN 5 % IV SOLN: INTRAVENOUS | Status: DC | PRN

## 2020-03-22 MED ORDER — HYDROCODONE-ACETAMINOPHEN 7.5-325 MG PO TABS: 1.0000 | ORAL_TABLET | ORAL | Status: DC | PRN

## 2020-03-22 MED ORDER — CHLORHEXIDINE GLUCONATE CLOTH 2 % EX PADS
6.0000 | MEDICATED_PAD | Freq: Every day | CUTANEOUS | Status: DC
  Administered 2020-03-22 – 2020-03-23 (×2): 6 via TOPICAL

## 2020-03-22 MED ORDER — MORPHINE SULFATE (PF) 2 MG/ML IV SOLN: 0.5000 mg | INTRAVENOUS | Status: DC | PRN

## 2020-03-22 MED ORDER — ALUM & MAG HYDROXIDE-SIMETH 200-200-20 MG/5ML PO SUSP: 30.0000 mL | ORAL | Status: DC | PRN

## 2020-03-22 MED ORDER — ENOXAPARIN SODIUM 40 MG/0.4ML ~~LOC~~ SOLN
40.0000 mg | SUBCUTANEOUS | Status: DC
  Administered 2020-03-23 – 2020-03-24 (×2): 40 mg via SUBCUTANEOUS
  Filled 2020-03-22 (×3): qty 0.4

## 2020-03-22 MED ORDER — POLYETHYLENE GLYCOL 3350 17 G PO PACK: 17.0000 g | PACK | Freq: Every day | ORAL | Status: DC | PRN

## 2020-03-22 MED ORDER — ASPIRIN EC 81 MG PO TBEC
81.0000 mg | DELAYED_RELEASE_TABLET | Freq: Every day | ORAL | Status: DC
  Administered 2020-03-23 – 2020-03-24 (×2): 81 mg via ORAL
  Filled 2020-03-22 (×2): qty 1

## 2020-03-22 MED ORDER — ALBUMIN HUMAN 5 % IV SOLN
INTRAVENOUS | Status: AC
  Filled 2020-03-22: qty 250

## 2020-03-22 MED ORDER — ACETAMINOPHEN 500 MG PO TABS
500.0000 mg | ORAL_TABLET | Freq: Four times a day (QID) | ORAL | Status: AC
  Administered 2020-03-22 – 2020-03-23 (×2): 500 mg via ORAL
  Filled 2020-03-22 (×2): qty 1

## 2020-03-22 MED ORDER — CEFAZOLIN SODIUM-DEXTROSE 1-4 GM/50ML-% IV SOLN
1.0000 g | Freq: Four times a day (QID) | INTRAVENOUS | Status: AC
  Administered 2020-03-22 – 2020-03-23 (×2): 1 g via INTRAVENOUS
  Filled 2020-03-22 (×2): qty 50

## 2020-03-22 MED ORDER — MAGNESIUM CITRATE PO SOLN
1.0000 | Freq: Once | ORAL | Status: DC | PRN
  Filled 2020-03-22: qty 296

## 2020-03-22 MED ORDER — MELATONIN 5 MG PO TABS
10.0000 mg | ORAL_TABLET | Freq: Every day | ORAL | Status: DC
  Administered 2020-03-22: 10 mg via ORAL
  Filled 2020-03-22 (×2): qty 2

## 2020-03-22 MED ORDER — ADULT MULTIVITAMIN W/MINERALS CH
1.0000 | ORAL_TABLET | Freq: Every day | ORAL | Status: DC
  Administered 2020-03-23 – 2020-03-24 (×2): 1 via ORAL
  Filled 2020-03-22 (×2): qty 1

## 2020-03-22 MED ORDER — BUPIVACAINE-EPINEPHRINE (PF) 0.25% -1:200000 IJ SOLN
INTRAMUSCULAR | Status: AC
  Filled 2020-03-22: qty 30

## 2020-03-22 MED ORDER — SODIUM CHLORIDE 0.9 % IV SOLN
INTRAVENOUS | Status: DC | PRN
  Administered 2020-03-22: 30 ug/min via INTRAVENOUS

## 2020-03-22 MED ORDER — ACETAMINOPHEN 325 MG PO TABS
325.0000 mg | ORAL_TABLET | Freq: Four times a day (QID) | ORAL | Status: DC | PRN
  Administered 2020-03-23 (×2): 325 mg via ORAL
  Filled 2020-03-22: qty 1

## 2020-03-22 MED ORDER — METHOCARBAMOL 1000 MG/10ML IJ SOLN
500.0000 mg | Freq: Four times a day (QID) | INTRAVENOUS | Status: DC | PRN
  Filled 2020-03-22: qty 5

## 2020-03-22 MED ORDER — DEXMEDETOMIDINE HCL IN NACL 400 MCG/100ML IV SOLN
INTRAVENOUS | Status: DC | PRN
  Administered 2020-03-22: 4 ug via INTRAVENOUS
  Administered 2020-03-22 (×2): 8 ug via INTRAVENOUS

## 2020-03-22 MED ORDER — ESCITALOPRAM OXALATE 10 MG PO TABS
20.0000 mg | ORAL_TABLET | Freq: Every day | ORAL | Status: DC
  Administered 2020-03-23: 20 mg via ORAL
  Filled 2020-03-22 (×4): qty 2

## 2020-03-22 MED ORDER — ONDANSETRON HCL 4 MG/2ML IJ SOLN
INTRAMUSCULAR | Status: AC
  Filled 2020-03-22: qty 2

## 2020-03-22 MED ORDER — PANTOPRAZOLE SODIUM 40 MG PO TBEC
40.0000 mg | DELAYED_RELEASE_TABLET | Freq: Every day | ORAL | Status: DC
  Administered 2020-03-23 – 2020-03-24 (×2): 40 mg via ORAL
  Filled 2020-03-22 (×2): qty 1

## 2020-03-22 MED ORDER — DIVALPROEX SODIUM ER 250 MG PO TB24
250.0000 mg | ORAL_TABLET | Freq: Every day | ORAL | Status: DC
  Administered 2020-03-23 – 2020-03-24 (×2): 250 mg via ORAL
  Filled 2020-03-22 (×5): qty 1

## 2020-03-22 MED ORDER — PROPOFOL 10 MG/ML IV BOLUS
INTRAVENOUS | Status: DC | PRN
  Administered 2020-03-22: 25 ug/kg/min via INTRAVENOUS
  Administered 2020-03-22: 15 mg via INTRAVENOUS

## 2020-03-22 MED ORDER — PRAVASTATIN SODIUM 20 MG PO TABS
20.0000 mg | ORAL_TABLET | Freq: Every day | ORAL | Status: DC
  Administered 2020-03-22 – 2020-03-24 (×3): 20 mg via ORAL
  Filled 2020-03-22 (×3): qty 1

## 2020-03-22 MED ORDER — PROPOFOL 500 MG/50ML IV EMUL
INTRAVENOUS | Status: AC
  Filled 2020-03-22: qty 50

## 2020-03-22 MED ORDER — SENNA 8.6 MG PO TABS
1.0000 | ORAL_TABLET | Freq: Two times a day (BID) | ORAL | Status: DC
  Administered 2020-03-22 – 2020-03-23 (×2): 8.6 mg via ORAL
  Filled 2020-03-22 (×4): qty 1

## 2020-03-22 MED ORDER — TRAMADOL HCL 50 MG PO TABS
50.0000 mg | ORAL_TABLET | Freq: Four times a day (QID) | ORAL | Status: DC
  Administered 2020-03-22 – 2020-03-24 (×4): 50 mg via ORAL
  Filled 2020-03-22 (×6): qty 1

## 2020-03-22 MED ORDER — METHOCARBAMOL 500 MG PO TABS: 500.0000 mg | ORAL_TABLET | Freq: Four times a day (QID) | ORAL | Status: DC | PRN

## 2020-03-22 MED ORDER — CEFAZOLIN SODIUM-DEXTROSE 2-4 GM/100ML-% IV SOLN
INTRAVENOUS | Status: AC
  Filled 2020-03-22: qty 100

## 2020-03-22 MED ORDER — FENTANYL CITRATE (PF) 100 MCG/2ML IJ SOLN
25.0000 ug | INTRAMUSCULAR | Status: DC | PRN
Start: 2020-03-22 — End: 2020-03-22

## 2020-03-22 MED ORDER — ONDANSETRON HCL 4 MG PO TABS: 4.0000 mg | ORAL_TABLET | Freq: Four times a day (QID) | ORAL | Status: DC | PRN

## 2020-03-22 MED ORDER — GENTAMICIN SULFATE 40 MG/ML IJ SOLN
INTRAMUSCULAR | Status: AC
  Filled 2020-03-22: qty 2

## 2020-03-22 MED ORDER — PHENYLEPHRINE HCL (PRESSORS) 10 MG/ML IV SOLN
INTRAVENOUS | Status: DC | PRN
  Administered 2020-03-22 (×2): 100 ug via INTRAVENOUS

## 2020-03-22 MED ORDER — BISACODYL 10 MG RE SUPP: 10.0000 mg | Freq: Every day | RECTAL | Status: DC | PRN

## 2020-03-22 MED ORDER — DOCUSATE SODIUM 100 MG PO CAPS
100.0000 mg | ORAL_CAPSULE | Freq: Two times a day (BID) | ORAL | Status: DC
  Administered 2020-03-22 – 2020-03-23 (×3): 100 mg via ORAL
  Filled 2020-03-22 (×4): qty 1

## 2020-03-22 MED ORDER — SODIUM CHLORIDE 0.9 % IR SOLN
Status: DC | PRN
  Administered 2020-03-22: 1000 mL

## 2020-03-22 SURGICAL SUPPLY — 47 items
BIT DRILL 4.3MMS DISTAL GRDTED (BIT) IMPLANT
BLADE SURG 15 STRL LF DISP TIS (BLADE) IMPLANT
BLADE SURG 15 STRL SS (BLADE) ×2
BNDG COHESIVE 6X5 TAN STRL LF (GAUZE/BANDAGES/DRESSINGS) ×6 IMPLANT
CANISTER SUCT 1200ML W/VALVE (MISCELLANEOUS) ×3 IMPLANT
CORTICAL BONE SCR 5.0MM X 46MM (Screw) ×3 IMPLANT
CORTICAL BONE SCR 5.0MM X 48MM (Screw) ×3 IMPLANT
COUNTER NEEDLE 20/40 LG (NEEDLE) ×2 IMPLANT
COVER WAND RF STERILE (DRAPES) ×3 IMPLANT
CRADLE LAMINECT ARM (MISCELLANEOUS) ×3 IMPLANT
DRAPE 3/4 80X56 (DRAPES) ×6 IMPLANT
DRAPE SURG 17X11 SM STRL (DRAPES) ×6 IMPLANT
DRAPE U-SHAPE 47X51 STRL (DRAPES) ×3 IMPLANT
DRILL 4.3MMS DISTAL GRADUATED (BIT) ×3
DRSG OPSITE POSTOP 3X4 (GAUZE/BANDAGES/DRESSINGS) ×6 IMPLANT
DRSG OPSITE POSTOP 4X14 (GAUZE/BANDAGES/DRESSINGS) IMPLANT
DRSG OPSITE POSTOP 4X6 (GAUZE/BANDAGES/DRESSINGS) ×3 IMPLANT
DURAPREP 26ML APPLICATOR (WOUND CARE) ×4 IMPLANT
ELECT REM PT RETURN 9FT ADLT (ELECTROSURGICAL) ×3
ELECTRODE REM PT RTRN 9FT ADLT (ELECTROSURGICAL) ×1 IMPLANT
GLOVE BIOGEL PI IND STRL 9 (GLOVE) ×1 IMPLANT
GLOVE BIOGEL PI INDICATOR 9 (GLOVE) ×2
GLOVE SURG 9.0 ORTHO LTXF (GLOVE) ×6 IMPLANT
GOWN STRL REUS TWL 2XL XL LVL4 (GOWN DISPOSABLE) ×3 IMPLANT
GOWN STRL REUS W/ TWL LRG LVL3 (GOWN DISPOSABLE) ×1 IMPLANT
GOWN STRL REUS W/TWL LRG LVL3 (GOWN DISPOSABLE) ×2
GUIDEPIN VERSANAIL DSP 3.2X444 (ORTHOPEDIC DISPOSABLE SUPPLIES) ×2 IMPLANT
GUIDEWIRE BALL NOSE 100CM (WIRE) ×2 IMPLANT
HEMOVAC 400CC 10FR (MISCELLANEOUS) IMPLANT
HFN LH 130 DEG 11MM X 380MM (Orthopedic Implant) ×2 IMPLANT
HIP FR NAIL LAG SCREW 10.5X110 (Orthopedic Implant) ×3 IMPLANT
KIT TURNOVER CYSTO (KITS) ×3 IMPLANT
MAT ABSORB  FLUID 56X50 GRAY (MISCELLANEOUS) ×2
MAT ABSORB FLUID 56X50 GRAY (MISCELLANEOUS) ×1 IMPLANT
NS IRRIG 1000ML POUR BTL (IV SOLUTION) ×3 IMPLANT
PACK HIP COMPR (MISCELLANEOUS) ×3 IMPLANT
SCREW CORTICL BON 5.0MM X 46MM (Screw) IMPLANT
SCREW CORTICL BON 5.0MM X 48MM (Screw) IMPLANT
SCREW LAG HIP FR NAIL 10.5X110 (Orthopedic Implant) IMPLANT
STAPLER SKIN PROX 35W (STAPLE) ×3 IMPLANT
SUCTION FRAZIER HANDLE 10FR (MISCELLANEOUS) ×2
SUCTION TUBE FRAZIER 10FR DISP (MISCELLANEOUS) ×1 IMPLANT
SUT VIC AB 0 CT1 36 (SUTURE) ×6 IMPLANT
SUT VIC AB 2-0 CT1 27 (SUTURE) ×2
SUT VIC AB 2-0 CT1 TAPERPNT 27 (SUTURE) ×1 IMPLANT
SUT VICRYL 0 AB UR-6 (SUTURE) ×3 IMPLANT
SYR 30ML LL (SYRINGE) ×3 IMPLANT

## 2020-03-22 NOTE — TOC Progression Note (Signed)
Transition of Care [facility]) - Progression Note    Patient Details  Name: TAMMIE FEARING MRN: [id number] Date of Birth: 02-07-1922  Transition of [facility]) CM/SW [place], RN Phone Number: 03/22/2020, 11:24 AM  Clinical Narrative:     Spoke with the daughter Marlowe Kays on the phone, The patient has advanced dementia, He lives at home and has 3 full time caregivers, 24/7 He needs a 3 in 1 and a RW, Brad with [employer] is aware, he is set up with [facility] services, His daughter wants him to go home not to SNF      Expected Discharge Plan and Services                                                 Social Determinants of Health (SDOH) Interventions    Readmission Risk Interventions No flowsheet data found.

## 2020-03-22 NOTE — Consult Note (Addendum)
Travis Mccoy is a 86 y.o. male  [id number]  Primary Cardiologist: Neoma Laming Reason for Consultation: Pre-op [place] Clearance  HPI: Travis Mccoy is a 86 year old Caucasian male with a past medical history of GERD hyperlipidemia and hypertension.  Travis Mccoy presented to the ER after a mechanical fall resulting in a left intertrochanteric femur fracture and we have been consulted for preoperative cardiac clearance.  While Travis Mccoy was in the ER he was found to be in atrial flutter on EKG. Travis Mccoy is currently hemodynamically stable with rate controlled atrial flutter   Review of Systems: Travis Mccoy denies chest pain, shortness of breath, orthopnea, and PND   Past Medical History:  Diagnosis Date   GERD (gastroesophageal reflux disease)    Hyperlipidemia    Hypertension     Medications Prior to Admission  Medication Sig Dispense Refill   aspirin 81 MG tablet Take 1 tablet (81 mg total) by mouth daily. 90 tablet 3   divalproex (DEPAKOTE ER) 250 MG 24 hr tablet Take 1 tablet every afternoon. May give additional dose as needed for agitation 135 tablet 3   escitalopram (LEXAPRO) 20 MG tablet Take 1 tablet (20 mg total) by mouth daily. 90 tablet 3   Melatonin 10 MG TABS Take 10 mg by mouth at bedtime.     memantine (NAMENDA) 5 MG tablet Take 1 tablet twice a day 180 tablet 3   Multiple Vitamin (MULTIVITAMIN WITH MINERALS) TABS tablet Take 1 tablet by mouth daily.     Multiple Vitamins-Minerals (PRESERVISION AREDS 2+MULTI VIT PO) Take 2 tablets by mouth daily.      omeprazole (PRILOSEC) 20 MG capsule TAKE 1 CAPSULE BY MOUTH  DAILY 90 capsule 3   pravastatin (PRAVACHOL) 20 MG tablet TAKE 1 TABLET BY MOUTH  EVERY OTHER EVENING 45 tablet 3      Chlorhexidine Gluconate Cloth  6 each Topical Daily   divalproex  250 mg Oral Daily   escitalopram  20 mg Oral Daily   melatonin  10 mg Oral QHS   memantine  5 mg Oral Daily   multivitamin with minerals  1 tablet Oral Daily   pantoprazole  40 mg Oral  Daily   pravastatin  20 mg Oral q1800   senna  1 tablet Oral BID    Infusions:  sodium chloride 75 mL/hr at 03/22/20 0913    ceFAZolin (ANCEF) IV      Allergies  Allergen Reactions   Lisinopril Cough    Social History   Socioeconomic History   Marital status: Widowed    Spouse name: Not on file   Number of children: Not on file   Years of education: Not on file   Highest education level: 12th grade  Occupational History   Not on file  Tobacco Use   Smoking status: Former Smoker    Types: Cigarettes    Quit date: 08/30/1959    Years since quitting: 60.6   Smokeless tobacco: Former Network engineer and Sexual Activity   Alcohol use: Not Currently   Drug use: No   Sexual activity: Not on file  Other Topics Concern   Not on file  Social History Narrative   Lives alone      One story home      Completed 12th grade      Walkes with cane      Right handed   Social Determinants of Health   Financial Resource Strain:    Difficulty of Paying Living Expenses:   Food Insecurity:  Worried About Charity fundraiser in the Last Year:    Arboriculturist in the Last Year:   Transportation Needs:    Film/video editor (Medical):    Lack of Transportation (Non-Medical):   Physical Activity:    Days of Exercise per Week:    Minutes of Exercise per Session:   Stress:    Feeling of Stress :   Social Connections:    Frequency of Communication with Friends and Family:    Frequency of Social Gatherings with Friends and Family:    Attends Religious Services:    Active Member of Clubs or Organizations:    Attends Archivist Meetings:    Marital Status:   Intimate Partner Violence:    Fear of Current or Ex-Partner:    Emotionally Abused:    Physically Abused:    Sexually Abused:     Family History  Problem Relation Age of Onset   Stroke Father     PHYSICAL EXAM: Vitals:   03/21/20 2349 03/22/20 0735  BP: (!) 118/55 (!) 100/51  Pulse: 70 70   Resp: 16 16  Temp: 98.7 F (37.1 C) 97.9 F (36.6 C)  SpO2: 100% 100%     Intake/Output Summary (Last 24 hours) at 03/22/2020 1019 Last data filed at 03/22/2020 0916 Gross per 24 hour  Intake --  Output 500 ml  Net -500 ml    General:  Well appearing. No respiratory difficulty HEENT: normal Neck: supple. no JVD. Carotids 2+ bilat; no bruits. No lymphadenopathy or thryomegaly appreciated. Cor: PMI nondisplaced. Regular rate & rhythm. No rubs, gallops or murmurs. Lungs: clear Abdomen: soft, nontender, nondistended. No hepatosplenomegaly. No bruits or masses. Good bowel sounds. Extremities: no cyanosis, clubbing, rash, edema Neuro: alert & oriented x 3, cranial nerves grossly intact. moves all 4 extremities w/o difficulty. Affect pleasant.  ECG: Atrial flutter with a right bundle branch block and a rate of 71.  QTc 507.  Results for orders placed or performed during the hospital encounter of 03/21/20 (from the past 24 hour(s))  CBC     Status: Abnormal   Collection Time: 03/21/20  2:35 PM  Result Value Ref Range   WBC 6.3 4.0 - 10.5 K/uL   RBC 3.49 (L) 4.22 - 5.81 MIL/uL   Hemoglobin 11.4 (L) 13.0 - 17.0 g/dL   HCT 34.2 (L) 39.0 - 52.0 %   MCV 98.0 80.0 - 100.0 fL   MCH 32.7 26.0 - 34.0 pg   MCHC 33.3 30.0 - 36.0 g/dL   RDW 14.0 11.5 - 15.5 %   Platelets 168 150 - 400 K/uL   nRBC 0.0 0.0 - 0.2 %  Comprehensive metabolic panel     Status: Abnormal   Collection Time: 03/21/20  2:35 PM  Result Value Ref Range   Sodium 139 135 - 145 mmol/L   Potassium 4.5 3.5 - 5.1 mmol/L   Chloride 104 98 - 111 mmol/L   CO2 25 22 - 32 mmol/L   Glucose, Bld 107 (H) 70 - 99 mg/dL   BUN 30 (H) 8 - 23 mg/dL   Creatinine, Ser 0.89 0.61 - 1.24 mg/dL   Calcium 9.1 8.9 - 10.3 mg/dL   Total Protein 5.9 (L) 6.5 - 8.1 g/dL   Albumin 3.5 3.5 - 5.0 g/dL   AST 31 15 - 41 U/L   ALT 19 0 - 44 U/L   Alkaline Phosphatase 68 38 - 126 U/L   Total Bilirubin 0.8 0.3 - 1.2  mg/dL   GFR calc non Af Amer  >60 >60 mL/min   GFR calc Af Amer >60 >60 mL/min   Anion gap 10 5 - 15  Respiratory Panel by RT PCR (Flu A&B, Covid) - Nasopharyngeal Swab     Status: None   Collection Time: 03/21/20  3:55 PM   Specimen: Nasopharyngeal Swab  Result Value Ref Range   SARS Coronavirus 2 by RT PCR NEGATIVE NEGATIVE   Influenza A by PCR NEGATIVE NEGATIVE   Influenza B by PCR NEGATIVE NEGATIVE  Protime-INR     Status: None   Collection Time: 03/21/20  8:27 PM  Result Value Ref Range   Prothrombin Time 13.7 11.4 - 15.2 seconds   INR 1.1 0.8 - 1.2  APTT     Status: None   Collection Time: 03/21/20  8:27 PM  Result Value Ref Range   aPTT 28 24 - 36 seconds  Type and screen [place]     Status: None   Collection Time: 03/22/20  8:59 AM  Result Value Ref Range   ABO/RH(D) O POS    Antibody Screen NEG    Sample Expiration      03/25/2020,2359 Performed at [facility], [address] [place], [place] [address]    DG Chest 1 View  Result Date: 03/21/2020 CLINICAL DATA:  Pt arrives via [facility] from home after attempting to get out of the car and falling back into the car. PT has shortening of left leg and c/o bottom pain on left side. Pt unable to bear weight on the left leg. Left leg is foreshortened; EXAM: CHEST  1 VIEW COMPARISON:  03/14/2014 FINDINGS: Cardiac silhouette is normal in size. No mediastinal or hilar masses. No evidence of adenopathy. Mild linear scarring or atelectasis the lung bases, greater on the left. Lungs are otherwise clear. No convincing pleural effusion and no pneumothorax. Skeletal structures are demineralized but grossly intact. IMPRESSION: No active disease. Electronically Signed   By: Lajean Manes M.D.   On: 03/21/2020 15:37   DG HIP UNILAT WITH PELVIS 2-3 VIEWS LEFT  Result Date: 03/21/2020 CLINICAL DATA:  Pt arrives via [facility] from home after attempting to get out of the car and falling back into the car. PT has shortening of left leg and c/o  bottom pain on left side. Pt unable to bear weight on the left leg. Left leg is foreshortened; Pre op EXAM: DG HIP (WITH OR WITHOUT PELVIS) 2-3V LEFT COMPARISON:  None. FINDINGS: Intertrochanteric fracture of the proximal left femur. Primary fracture components are well aligned with minimal displacement and no significant angulation. There are separate fracture components of the lesser and greater trochanters. No other fractures.  No bone lesions. Hip joints, SI joints and symphysis pubis are normally aligned. Skeletal structures are diffusely demineralized. IMPRESSION: 1. Comminuted intertrochanteric fracture of the proximal left femur with no significant displacement or angulation. Electronically Signed   By: Lajean Manes M.D.   On: 03/21/2020 15:38     ASSESSMENT AND PLAN: Travis Mccoy is currently resting comfortably and hemodynamically stable while in atrial flutter.  Currently the flutter is rate controlled in the 70s.  In the setting of the Travis Mccoy being asymptomatic, he is cleared for surgery with a plan for an echocardiogram post-op.  We will follow up with the Travis Mccoy after his surgical procedure to further evaluate his atrial flutter.  Travis Mccoy's chads-vasc score is 2 but with his surgery and age we will hold off on anticoagulation for now.  Agree  Adaline Sill, NP-C

## 2020-03-22 NOTE — Anesthesia Preprocedure Evaluation (Addendum)
Anesthesia Evaluation  Patient identified by MRN, date of birth, ID band Patient awake    Reviewed: Allergy & Precautions, H&P , NPO status , Patient's Chart, lab work & pertinent test results  History of Anesthesia Complications Negative for: history of anesthetic complications  Airway   TM Distance: <3 FB    Comment: Unable to comply with Mallampati exam due to confusion Dental  (+) Chipped   Pulmonary neg pulmonary ROS, neg shortness of breath, neg sleep apnea, neg COPD, neg recent URI, former smoker,    breath sounds clear to auscultation       Cardiovascular hypertension, + dysrhythmias (No known cardiac history.  Noted to be in AFlutter on arrival to ED, hemodynamically stable and rate controlled.  Cardiology consulted, cleared for surgery, plan for echo post op) Atrial Fibrillation  Rhythm:regular Rate:Normal     Neuro/Psych PSYCHIATRIC DISORDERS Dementia Alzheimer's dementia    GI/Hepatic Neg liver ROS, GERD  ,  Endo/Other  negative endocrine ROS  Renal/GU      Musculoskeletal   Abdominal   Peds  Hematology negative hematology ROS (+)   Anesthesia Other Findings Past Medical History: No date: GERD (gastroesophageal reflux disease) No date: Hyperlipidemia No date: Hypertension  Past Surgical History: 2009: HERNIA REPAIR 10/18/2018: SKIN CANCER EXCISION; Right     Comment:  ear  BMI    Body Mass Index: 23.13 kg/m      Reproductive/Obstetrics negative OB ROS                           Anesthesia Physical Anesthesia Plan  ASA: II  Anesthesia Plan: Spinal   Post-op Pain Management:    Induction:   PONV Risk Score and Plan: Propofol infusion  Airway Management Planned: Natural Airway and Simple Face Mask  Additional Equipment:   Intra-op Plan:   Post-operative Plan:   Informed Consent: I have reviewed the patients History and Physical, chart, labs and discussed the  procedure including the risks, benefits and alternatives for the proposed anesthesia with the patient or authorized representative who has indicated his/her understanding and acceptance.     Dental Advisory Given  Plan Discussed with: Anesthesiologist  Anesthesia Plan Comments: (Phone consent obtained from daughter Sheryle Hail.  Consented to spinal with sedation, backup plan GETA.  KR)       Anesthesia Quick Evaluation

## 2020-03-22 NOTE — Progress Notes (Signed)
*  PRELIMINARY RESULTS* Echocardiogram 2D Echocardiogram has been performed.  Travis Mccoy 03/22/2020, 3:28 PM

## 2020-03-22 NOTE — Progress Notes (Signed)
[facility] [place] NAME: Travis Mccoy    MR#:  [id number]  DATE OF BIRTH:  05/25/1922  SUBJECTIVE:   Patient has baseline dementia. Caregiver Beverly in the room. No issues per RN. REVIEW OF SYSTEMS:   Review of Systems  Unable to perform ROS: Dementia   Tolerating Diet: Tolerating PT:   DRUG ALLERGIES:   Allergies  Allergen Reactions  . Lisinopril Cough    VITALS:  Blood pressure (!) 100/51, pulse 70, temperature 97.9 F (36.6 C), temperature source Oral, resp. rate 16, height 5\' 7"  (1.702 m), weight 67 kg, SpO2 100 %.  PHYSICAL EXAMINATION:   Physical Exam  GENERAL:  86 y.o.-year-old patient lying in the bed with no acute distress. Marland Kitchen   HEENT: Head atraumatic, normocephalic. Oropharynx and nasopharynx clear.  NECK:  Supple, no jugular venous distention. No thyroid enlargement, no tenderness.  LUNGS: Normal breath sounds bilaterally, no wheezing, rales, rhonchi. No use of accessory muscles of respiration.  CARDIOVASCULAR: S1, S2 normal. No murmurs, rubs, or gallops.  ABDOMEN: Soft, nontender, nondistended. Bowel sounds present. No organomegaly or mass.  EXTREMITIES: No cyanosis, clubbing or edema b/l.   Left lower extremity short-term, decreased range of motion NEUROLOGIC: grossly nonfocal  PSYCHIATRIC:  patient is alert and and awake. Pleasantly confused SKIN: No obvious rash, lesion, or ulcer.   LABORATORY PANEL:  CBC Recent Labs  Lab 03/21/20 1435  WBC 6.3  HGB 11.4*  HCT 34.2*  PLT 168    Chemistries  Recent Labs  Lab 03/21/20 1435  NA 139  K 4.5  CL 104  CO2 25  GLUCOSE 107*  BUN 30*  CREATININE 0.89  CALCIUM 9.1  AST 31  ALT 19  ALKPHOS 68  BILITOT 0.8   Cardiac Enzymes No results for input(s): TROPONINI in the last 168 hours. RADIOLOGY:  DG Chest 1 View  Result Date: 03/21/2020 CLINICAL DATA:  Pt arrives via [facility] from home after attempting to get out of the car and falling back into the  car. PT has shortening of left leg and c/o bottom pain on left side. Pt unable to bear weight on the left leg. Left leg is foreshortened; EXAM: CHEST  1 VIEW COMPARISON:  03/14/2014 FINDINGS: Cardiac silhouette is normal in size. No mediastinal or hilar masses. No evidence of adenopathy. Mild linear scarring or atelectasis the lung bases, greater on the left. Lungs are otherwise clear. No convincing pleural effusion and no pneumothorax. Skeletal structures are demineralized but grossly intact. IMPRESSION: No active disease. Electronically Signed   By: Lajean Manes M.D.   On: 03/21/2020 15:37   DG HIP UNILAT WITH PELVIS 2-3 VIEWS LEFT  Result Date: 03/21/2020 CLINICAL DATA:  Pt arrives via [facility] from home after attempting to get out of the car and falling back into the car. PT has shortening of left leg and c/o bottom pain on left side. Pt unable to bear weight on the left leg. Left leg is foreshortened; Pre op EXAM: DG HIP (WITH OR WITHOUT PELVIS) 2-3V LEFT COMPARISON:  None. FINDINGS: Intertrochanteric fracture of the proximal left femur. Primary fracture components are well aligned with minimal displacement and no significant angulation. There are separate fracture components of the lesser and greater trochanters. No other fractures.  No bone lesions. Hip joints, SI joints and symphysis pubis are normally aligned. Skeletal structures are diffusely demineralized. IMPRESSION: 1. Comminuted intertrochanteric fracture of the proximal left femur with no significant displacement or angulation. Electronically Signed  By: Lajean Manes M.D.   On: 03/21/2020 15:38   ASSESSMENT AND PLAN:  Wandell Fetterman  is a 86 y.o. male with a known history of Alzheimer's dementia, hyperlipidemia, hypertension comes to the emergency room after patient had a mechanical fall while getting out of the car fell backwards into the car and started having left hip pain.  Acute left proximal intertrochanteric femur fracture status post  mechanical fall -admit to orthopedic floor -DR. KRASINSKI's input aprreciated -patient is at a low to intermediate risk for surgery--Ok from cardiology standpoint for surgery. D/w Dr Humphrey Rolls on the phone this am -PRN IV and PO pain meds -PT//OT after surgery -DVT prophylaxis after surgery -IV fluids  Abnormal EKG-- atrial flutter -patient asymptomatic -no cardiac history--- this was confirmed with patient's daughter Meta Hatchet -will continue to monitor on telemetry -Dr Humphrey Rolls consulted --called and spoke with him--ok for surgery. -Echo post surgery -no old EKG for comparison  Hypertension bp stable--little low -not on any home bp meds  Dementia-- chronic resume Namenda, depakote and lexapro patient has 24x7 caregivers at home.  Hyperlipidemia Resume statins  BPH on Flomax  Gerd continue PPI   Family Communication : spoke with daughter Meta Hatchet on the phone. She understands the risk and benefits for surgery. Consults : orthopedic dr Mack Guise Code Status : DNR prior to admission-- confirmed with healthcare power of attorney daughter Marlowe Kays DVT prophylaxis : SCD   TOTAL TIME TAKING CARE OF THIS PATIENT: *35* minutes.  >50% time spent on counselling and coordination of care  Note: This dictation was prepared with Dragon dictation along with smaller phrase technology. Any transcriptional errors that result from this process are unintentional.  Fritzi Mandes M.D    [facility]   CC: Primary care physician; No primary care provider on file.Patient ID: NASEEM STEPHAN, male   DOB: 02/17/1922, 86 y.o.   MRN: [id number]

## 2020-03-22 NOTE — Transfer of Care (Signed)
Immediate Anesthesia Transfer of Care Note  Patient: Travis Mccoy  Procedure(s) Performed: INTRAMEDULLARY (IM) NAIL INTERTROCHANTRIC (Left )  Patient Location: PACU  Anesthesia Type:General and Spinal  Level of Consciousness: drowsy  Airway & Oxygen Therapy: Patient Spontanous Breathing  Post-op Assessment: Report given to RN and Post -op Vital signs reviewed and stable  Post vital signs: Reviewed and stable  Last Vitals:  Vitals Value Taken Time  BP 89/42 03/22/20 1448  Temp 36.3 C 03/22/20 1440  Pulse 67 03/22/20 1449  Resp 15 03/22/20 1449  SpO2 97 % 03/22/20 1449  Vitals shown include unvalidated device data.  Last Pain:  Vitals:   03/22/20 1053  TempSrc: Temporal  PainSc:       Patients Stated Pain Goal: 0 ([id number])  Complications: No apparent anesthesia complications

## 2020-03-22 NOTE — Op Note (Signed)
03/22/2020  2:53 PM  PATIENT:  Travis Mccoy    PRE-OPERATIVE DIAGNOSIS:  Left Intertrochanteric Hip Fracture  POST-OPERATIVE DIAGNOSIS:  Same  PROCEDURE:  INTRAMEDULLARY (IM) FIXATION OF LEFT INTERTROCHANTERIC HIP FRACTURE  SURGEON:  Thornton Park, MD  ANESTHESIA:   Spinal  EBL:  50cc   IMPLANT:  ZIMMER BIOMET AFFIXUS NAIL 19mm x 333mm with a 132mm lag screw and distal interlocking screws 41mm  and 48 mm in length.  PREOPERATIVE INDICATIONS:  Travis Mccoy is a  86 y.o. male with a diagnosis of Left Intertrochanteric Hip Fracture who failed conservative measures and elected for surgical management.    The risks, benefits and alternatives were discussed with the patient and his daughter, Marlowe Kays.  The risks include but are not limited to infection, bleeding requiring blood transfusion, nerve or blood vessel injury, malunion, nonunion, hardware prominence, hardware failure, leg length discrepancy or change in lower extremity rotation and need for further surgery including hardware removal with conversion to a total hip arthroplasty. Medical risks include but are not limited to DVT and pulmonary embolism, myocardial infarction, stroke, pneumonia, respiratory failure and death. The patient and their daughter, Marlowe Kays, understood these risks and wished to proceed with surgery.  OPERATIVE PROCEDURE:  The patient was brought to the operating room and placed in the supine position on the fracture table. The patient received spinal anesthesia.  A closed reduction was performed under C-arm guidance.  The fracture reduction was confirmed on both AP and lateral views. After adequate reduction was achieved, a time out was performed to verify the patient's name, date of birth, medical record number, correct site of surgery correct procedure to be performed. The timeout was also used to verify the patient received antibiotics and all appropriate instruments, implants and radiographic studies were  available in the room. Once all in attendance were in agreement, the case began. The patient was prepped and draped in a sterile fashion. He received preoperative antibiotics with 2 grams of Kefzol.  An incision was made proximal to the greater trochanter in line with the femur. A guidewire was placed over the tip of the greater trochanter and advanced by drill into the proximal femur to the level of the lesser trochanter.  Confirmation of the drill pin position was made on AP and lateral C-arm images.  The threaded guidepin was then overdrilled with the proximal femoral entry reamer.  A ball-tipped guidewire was then advanced down the intramedullary canal, across the fracture, and down the femoral shaft to the knee.  The ball tip guidewire's position was confirmed at both the knee and hip via C-arm imaging. A depth gauge was used to measure the length of the long nail to be used. It was measured to be 380 mm.  Sequential intramedullary reamers were then passed down the femoral canal up to 13 mm.  The decision was made to use an 11 mm diameter nail.  The actual nail 11  X 380 mm in size was then inserted into the proximal femur, across the fracture site and down the femoral shaft. Its position was confirmed on AP and lateral C-arm images.  The ball tip guidewire was removed.  Once the nail was completely seated, the drill guide for the lag screw was placed through the guide arm for the Affixus nail. A guidepin was then placed through this drill guide and advanced through the lateral cortex of the femur, across the fracture site and into the femoral head achieving a tip apex distance of  less than 25 mm. The length of the drill pin was measured to 110, and then the drill for the lag screw was advanced through the lateral cortex, across the fracture site and up into the femoral head to the depth of the lag screw..  The lag screw was then advanced by hand into position across the fracture site into the femoral head.  Its final position was confirmed on AP and lateral C-arm images. Compression was applied as traction was carefully released. The set screw in the top of the intramedullary rod was tightened by hand using a screwdriver. It was backed off a quarter turn to allow for compression at the fracture site.  The attention was then turned to placement of the distal interlocking screws. A perfect circle technique was used.  Two small stab incisions were made over the distal interlocking screw holes.  A free hand technique was used to drill both distal interlocking screws. The depth of the screw holes was measured with a depth gauge. The 74mm and 39mm screws were then advanced into position and tightened by hand. Final C-arm images of the entire intramedullary construct were taken in both the AP and lateral planes.   The wounds were irrigated copiously and closed with 0 Vicryl for closure of the deep fascia and 2-0 Vicryl for subcutaneous closure. The skin was approximated with staples. A dry sterile dressing was applied. I was scrubbed and present the entire case and all sharp, sponge and instrument counts were correct at the conclusion of the case. Patient was transferred to a hospital bed and brought to PACU in stable condition.     Timoteo Gaul, MD

## 2020-03-22 NOTE — Anesthesia Procedure Notes (Signed)
Spinal  Patient location during procedure: OR Staffing Performed: anesthesiologist  Anesthesiologist: Molli Barrows, MD Preanesthetic Checklist Completed: patient identified, IV checked, site marked, risks and benefits discussed, surgical consent, monitors and equipment checked, pre-op evaluation and timeout performed Spinal Block Patient position: sitting Prep: DuraPrep Patient monitoring: heart rate, cardiac monitor, continuous pulse ox and blood pressure Approach: midline Location: L3-4 Injection technique: single-shot Needle Needle type: Sprotte  Needle gauge: 24 G Needle length: 9 cm Assessment Sensory level: T4

## 2020-03-22 NOTE — Progress Notes (Signed)
Verbal consent by Daughter Marlowe Kays was done, Sherlyn Lick as the verifier. Surgery RN informed.

## 2020-03-22 NOTE — Progress Notes (Cosign Needed)
Patient has Hip Fracture and Alzheimers disease which requires upper and lower body to be positioned in ways                                                                              not feasible with a normal bed. Head must be elevated at least 30 degrees or  It will increase pain in hip and increase risk for aspiration   frequently requires  changes in body position which cannot be achieved with a normal bed.

## 2020-03-22 NOTE — Progress Notes (Signed)
Subjective:  Patient seen in the preoperative area.  Patient has been cleared by medicine and cardiology.  Patient did not have any questions.  Patient will have an echocardiogram performed postop to further evaluate his atrial flutter which is rate controlled currently.  Objective:   VITALS:   Vitals:   03/21/20 1856 03/21/20 2349 03/22/20 0735 03/22/20 1053  BP: (!) 132/52 (!) 118/55 (!) 100/51 (!) 119/46  Pulse: (!) 51 70 70 70  Resp: 18 16 16 18   Temp: (!) 97.4 F (36.3 C) 98.7 F (37.1 C) 97.9 F (36.6 C) 97.6 F (36.4 C)  TempSrc: Oral  Oral Temporal  SpO2: 98% 100% 100% 100%  Weight:      Height:        PHYSICAL EXAM: Left lower extremity shortened and externally rotated Neurovascular intact Sensation intact distally Intact pulses distally Dorsiflexion/Plantar flexion intact No cellulitis present Compartment soft  LABS  Results for orders placed or performed during the hospital encounter of 03/21/20 (from the past 24 hour(s))  CBC     Status: Abnormal   Collection Time: 03/21/20  2:35 PM  Result Value Ref Range   WBC 6.3 4.0 - 10.5 K/uL   RBC 3.49 (L) 4.22 - 5.81 MIL/uL   Hemoglobin 11.4 (L) 13.0 - 17.0 g/dL   HCT 34.2 (L) 39.0 - 52.0 %   MCV 98.0 80.0 - 100.0 fL   MCH 32.7 26.0 - 34.0 pg   MCHC 33.3 30.0 - 36.0 g/dL   RDW 14.0 11.5 - 15.5 %   Platelets 168 150 - 400 K/uL   nRBC 0.0 0.0 - 0.2 %  Comprehensive metabolic panel     Status: Abnormal   Collection Time: 03/21/20  2:35 PM  Result Value Ref Range   Sodium 139 135 - 145 mmol/L   Potassium 4.5 3.5 - 5.1 mmol/L   Chloride 104 98 - 111 mmol/L   CO2 25 22 - 32 mmol/L   Glucose, Bld 107 (H) 70 - 99 mg/dL   BUN 30 (H) 8 - 23 mg/dL   Creatinine, Ser 0.89 0.61 - 1.24 mg/dL   Calcium 9.1 8.9 - 10.3 mg/dL   Total Protein 5.9 (L) 6.5 - 8.1 g/dL   Albumin 3.5 3.5 - 5.0 g/dL   AST 31 15 - 41 U/L   ALT 19 0 - 44 U/L   Alkaline Phosphatase 68 38 - 126 U/L   Total Bilirubin 0.8 0.3 - 1.2 mg/dL   GFR  calc non Af Amer >60 >60 mL/min   GFR calc Af Amer >60 >60 mL/min   Anion gap 10 5 - 15  Respiratory Panel by RT PCR (Flu A&B, Covid) - Nasopharyngeal Swab     Status: None   Collection Time: 03/21/20  3:55 PM   Specimen: Nasopharyngeal Swab  Result Value Ref Range   SARS Coronavirus 2 by RT PCR NEGATIVE NEGATIVE   Influenza A by PCR NEGATIVE NEGATIVE   Influenza B by PCR NEGATIVE NEGATIVE  Protime-INR     Status: None   Collection Time: 03/21/20  8:27 PM  Result Value Ref Range   Prothrombin Time 13.7 11.4 - 15.2 seconds   INR 1.1 0.8 - 1.2  APTT     Status: None   Collection Time: 03/21/20  8:27 PM  Result Value Ref Range   aPTT 28 24 - 36 seconds  Type and screen C-Road     Status: None   Collection Time: 03/22/20  8:59 AM  Result Value Ref Range   ABO/RH(D) O POS    Antibody Screen NEG    Sample Expiration      03/25/2020,2359 Performed at [facility], [address] [place], [place] [address]     DG Chest 1 View  Result Date: 03/21/2020 CLINICAL DATA:  Pt arrives via [facility] from home after attempting to get out of the car and falling back into the car. PT has shortening of left leg and c/o bottom pain on left side. Pt unable to bear weight on the left leg. Left leg is foreshortened; EXAM: CHEST  1 VIEW COMPARISON:  03/14/2014 FINDINGS: Cardiac silhouette is normal in size. No mediastinal or hilar masses. No evidence of adenopathy. Mild linear scarring or atelectasis the lung bases, greater on the left. Lungs are otherwise clear. No convincing pleural effusion and no pneumothorax. Skeletal structures are demineralized but grossly intact. IMPRESSION: No active disease. Electronically Signed   By: Lajean Manes M.D.   On: 03/21/2020 15:37   DG HIP UNILAT WITH PELVIS 2-3 VIEWS LEFT  Result Date: 03/21/2020 CLINICAL DATA:  Pt arrives via [facility] from home after attempting to get out of the car and falling back into the car. PT has shortening of  left leg and c/o bottom pain on left side. Pt unable to bear weight on the left leg. Left leg is foreshortened; Pre op EXAM: DG HIP (WITH OR WITHOUT PELVIS) 2-3V LEFT COMPARISON:  None. FINDINGS: Intertrochanteric fracture of the proximal left femur. Primary fracture components are well aligned with minimal displacement and no significant angulation. There are separate fracture components of the lesser and greater trochanters. No other fractures.  No bone lesions. Hip joints, SI joints and symphysis pubis are normally aligned. Skeletal structures are diffusely demineralized. IMPRESSION: 1. Comminuted intertrochanteric fracture of the proximal left femur with no significant displacement or angulation. Electronically Signed   By: Lajean Manes M.D.   On: 03/21/2020 15:38    Assessment/Plan: Day of Surgery   Active Problems:   Intertrochanteric fracture of left femur [facility])  Patient been cleared medically for surgery.  Plan for intramedullary fixation of his left intertrochanteric hip fracture.  I have spoken with his daughter Marlowe Kays by phone today to let her know the surgical plan.  She is in agreement with the plan for surgery and understands the associated risks.    Thornton Park , MD 03/22/2020, 12:33 PM

## 2020-03-23 DIAGNOSIS — S72145A Nondisplaced intertrochanteric fracture of left femur, initial encounter for closed fracture: Secondary | ICD-10-CM

## 2020-03-23 LAB — CBC
HCT: 22.1 % — ABNORMAL LOW (ref 39.0–52.0)
Hemoglobin: 7.3 g/dL — ABNORMAL LOW (ref 13.0–17.0)
MCH: 32.7 pg (ref 26.0–34.0)
MCHC: 33 g/dL (ref 30.0–36.0)
MCV: 99.1 fL (ref 80.0–100.0)
Platelets: 127 10*3/uL — ABNORMAL LOW (ref 150–400)
RBC: 2.23 MIL/uL — ABNORMAL LOW (ref 4.22–5.81)
RDW: 14.3 % (ref 11.5–15.5)
WBC: 6.8 10*3/uL (ref 4.0–10.5)
nRBC: 0 % (ref 0.0–0.2)

## 2020-03-23 LAB — URINALYSIS, COMPLETE (UACMP) WITH MICROSCOPIC
Bacteria, UA: NONE SEEN
Bilirubin Urine: NEGATIVE
Glucose, UA: NEGATIVE mg/dL
Hgb urine dipstick: NEGATIVE
Ketones, ur: 5 mg/dL — AB
Leukocytes,Ua: NEGATIVE
Nitrite: NEGATIVE
Protein, ur: NEGATIVE mg/dL
Specific Gravity, Urine: 1.02 (ref 1.005–1.030)
pH: 5 (ref 5.0–8.0)

## 2020-03-23 LAB — BASIC METABOLIC PANEL
Anion gap: 6 (ref 5–15)
BUN: 28 mg/dL — ABNORMAL HIGH (ref 8–23)
CO2: 26 mmol/L (ref 22–32)
Calcium: 8.2 mg/dL — ABNORMAL LOW (ref 8.9–10.3)
Chloride: 107 mmol/L (ref 98–111)
Creatinine, Ser: 0.73 mg/dL (ref 0.61–1.24)
GFR calc Af Amer: >60 mL/min (ref >60)
GFR calc non Af Amer: >60 mL/min (ref >60)
Glucose, Bld: 123 mg/dL — ABNORMAL HIGH (ref 70–99)
Potassium: 4.4 mmol/L (ref 3.5–5.1)
Sodium: 139 mmol/L (ref 135–145)

## 2020-03-23 LAB — ECHOCARDIOGRAM COMPLETE
Height: 67 in
Weight: 2363.33 oz

## 2020-03-23 LAB — ABO/RH: ABO/RH(D): O POS

## 2020-03-23 LAB — HEMOGLOBIN AND HEMATOCRIT, BLOOD
HCT: 26.2 % — ABNORMAL LOW (ref 39.0–52.0)
Hemoglobin: 8.7 g/dL — ABNORMAL LOW (ref 13.0–17.0)

## 2020-03-23 LAB — PREPARE RBC (CROSSMATCH)

## 2020-03-23 MED ORDER — POLYVINYL ALCOHOL 1.4 % OP SOLN
1.0000 [drp] | OPHTHALMIC | Status: DC | PRN
  Filled 2020-03-23: qty 15

## 2020-03-23 MED ORDER — ENSURE ENLIVE PO LIQD
237.0000 mL | Freq: Two times a day (BID) | ORAL | Status: DC
  Administered 2020-03-23 – 2020-03-24 (×2): 237 mL via ORAL

## 2020-03-23 MED ORDER — SODIUM CHLORIDE 0.9% IV SOLUTION: Freq: Once | INTRAVENOUS | Status: AC

## 2020-03-23 NOTE — TOC Progression Note (Signed)
Transition of Care (TOC) - Progression Note    Patient Details  Name: Travis Mccoy MRN: [id number] Date of Birth: 06/22/1922  Transition of Care (TOC) CM/SW Contact   J , RN Phone Number: 03/23/2020, 4:15 PM  Clinical Narrative:    Met with the son in the room at his request, He asked me what would it mean if the patient went to rehab.  I explained to him with advanced Dementia it could make the patient have increased confusion as he would not have visitors in Rehab, therefore he would not see any familiar faces.  Currently the patient  Has private pay sitters, 3 of them for 24/7 care, I explained that we have set up HH services for PT, Aide, OT and SW.  He has has a hospital bed delivered to the home and a RW. I explained that the equipment would need to be picked up if he was approved to go to rehab.  Then when he came home it would have to be reordered and redelivered.  I explained that Insurance could possible decline and not approve due to him not being able to have the cognition to remember what he learns at rehab with advanced dementia.  I explained if Insurance declined it would mean that they could appeal however if they did not get it overturned it would be private pay. I spoke with the Daughter Connie who is the POA on the phone.  I explained all of the information, she is in agreeance that he goes home, She wants PT, OT, Aide, SW and Palliative outpatient The bed has already been delivered and he has the RW .         Expected Discharge Plan and Services                                                 Social Determinants of Health (SDOH) Interventions    Readmission Risk Interventions No flowsheet data found.  

## 2020-03-23 NOTE — Progress Notes (Signed)
[facility] NAME: Travis Mccoy    MR#:  [id number]  DATE OF BIRTH:  1922/08/30  SUBJECTIVE:   Patient has baseline dementia. Caregiver Beverly in the room. Patient son in the room also.  No issues per RN patient is pleasantly confused REVIEW OF SYSTEMS:   Review of Systems  Unable to perform ROS: Dementia   Tolerating Diet: Tolerating PT: home health PT  DRUG ALLERGIES:   Allergies  Allergen Reactions  . Lisinopril Cough    VITALS:  Blood pressure (!) 137/50, pulse 72, temperature 98.2 F (36.8 C), temperature source Oral, resp. rate 18, height 5\' 7"  (1.702 m), weight 67 kg, SpO2 99 %.  PHYSICAL EXAMINATION:   Physical Exam  GENERAL:  86 y.o.-year-old patient lying in the bed with no acute distress. Marland Kitchen   HEENT: Head atraumatic, normocephalic. Oropharynx and nasopharynx clear.  NECK:  Supple, no jugular venous distention. No thyroid enlargement, no tenderness.  LUNGS: Normal breath sounds bilaterally, no wheezing, rales, rhonchi. No use of accessory muscles of respiration.  CARDIOVASCULAR: S1, S2 normal. No murmurs, rubs, or gallops.  ABDOMEN: Soft, nontender, nondistended. Bowel sounds present. No organomegaly or mass.  EXTREMITIES: No cyanosis, clubbing or edema b/l. Surgical dressing plus NEUROLOGIC: grossly nonfocal  PSYCHIATRIC:  patient is alert and and awake. Pleasantly confused SKIN: No obvious rash, lesion, or ulcer.   LABORATORY PANEL:  CBC Recent Labs  Lab 03/23/20 0541  WBC 6.8  HGB 7.3*  HCT 22.1*  PLT 127*    Chemistries  Recent Labs  Lab 03/21/20 1435 03/21/20 1435 03/23/20 0541  NA 139   < > 139  K 4.5   < > 4.4  CL 104   < > 107  CO2 25   < > 26  GLUCOSE 107*   < > 123*  BUN 30*   < > 28*  CREATININE 0.89   < > 0.73  CALCIUM 9.1   < > 8.2*  AST 31  --   --   ALT 19  --   --   ALKPHOS 68  --   --   BILITOT 0.8  --   --    < > = values in this interval not displayed.   Cardiac  Enzymes No results for input(s): TROPONINI in the last 168 hours. RADIOLOGY:  DG Chest 1 View  Result Date: 03/21/2020 CLINICAL DATA:  Pt arrives via [facility] from home after attempting to get out of the car and falling back into the car. PT has shortening of left leg and c/o bottom pain on left side. Pt unable to bear weight on the left leg. Left leg is foreshortened; EXAM: CHEST  1 VIEW COMPARISON:  03/14/2014 FINDINGS: Cardiac silhouette is normal in size. No mediastinal or hilar masses. No evidence of adenopathy. Mild linear scarring or atelectasis the lung bases, greater on the left. Lungs are otherwise clear. No convincing pleural effusion and no pneumothorax. Skeletal structures are demineralized but grossly intact. IMPRESSION: No active disease. Electronically Signed   By: Lajean Manes M.D.   On: 03/21/2020 15:37   ECHOCARDIOGRAM COMPLETE  Result Date: 03/23/2020    ECHOCARDIOGRAM REPORT   Patient Name:   Travis Mccoy Date of Exam: 03/22/2020 Medical Rec #:  [id number]     Height:       67.0 in Accession #:    [id number]    Weight:       147.7 lb Date of  Birth:  03/16/1922     BSA:          1.778 m Patient Age:    86 years      BP:           93/59 mmHg Patient Gender: M             HR:           67 bpm. Exam Location:  [facility] Procedure: 2D Echo, Cardiac Doppler and Color Doppler Indications:     Atrial Flutter 427.32  History:         Patient has no prior history of Echocardiogram examinations.                  Risk Factors:Hypertension.  Sonographer:     Sherrie Sport RDCS (AE) Referring Phys:  [place] Diagnosing Phys: Neoma Laming MD IMPRESSIONS  1. Left ventricular ejection fraction, by estimation, is 50 to 55%. The left ventricle has low normal function. The left ventricle demonstrates global hypokinesis. The left ventricular internal cavity size was moderately dilated. Left ventricular diastolic parameters are indeterminate.  2. Right ventricular systolic function is normal. The right  ventricular size is normal. There is moderately elevated pulmonary artery systolic pressure.  3. Left atrial size was moderately dilated.  4. Right atrial size was moderately dilated.  5. The mitral valve is normal in structure. Mild mitral valve regurgitation. No evidence of mitral stenosis.  6. The tricuspid valve is myxomatous. Tricuspid valve regurgitation is mild to moderate.  7. The aortic valve is normal in structure. Aortic valve regurgitation is trivial. Mild aortic valve sclerosis is present, with no evidence of aortic valve stenosis.  8. The inferior vena cava is normal in size with greater than 50% respiratory variability, suggesting right atrial pressure of 3 mmHg. FINDINGS  Left Ventricle: Left ventricular ejection fraction, by estimation, is 50 to 55%. The left ventricle has low normal function. The left ventricle demonstrates global hypokinesis. The left ventricular internal cavity size was moderately dilated. There is no left ventricular hypertrophy. Left ventricular diastolic parameters are indeterminate. Right Ventricle: The right ventricular size is normal. No increase in right ventricular wall thickness. Right ventricular systolic function is normal. There is moderately elevated pulmonary artery systolic pressure. The tricuspid regurgitant velocity is 3.08 m/s, and with an assumed right atrial pressure of 10 mmHg, the estimated right ventricular systolic pressure is [id number] mmHg. Left Atrium: Left atrial size was moderately dilated. Right Atrium: Right atrial size was moderately dilated. Pericardium: There is no evidence of pericardial effusion. Mitral Valve: The mitral valve is normal in structure. Normal mobility of the mitral valve leaflets. Mild mitral valve regurgitation. No evidence of mitral valve stenosis. Tricuspid Valve: The tricuspid valve is myxomatous. Tricuspid valve regurgitation is mild to moderate. No evidence of tricuspid stenosis. Aortic Valve: The aortic valve is normal in  structure. Aortic valve regurgitation is trivial. Mild aortic valve sclerosis is present, with no evidence of aortic valve stenosis. Aortic valve mean gradient measures 2.3 mmHg. Aortic valve peak gradient measures 4.0 mmHg. Aortic valve area, by VTI measures 2.05 cm. Pulmonic Valve: The pulmonic valve was normal in structure. Pulmonic valve regurgitation is not visualized. No evidence of pulmonic stenosis. Aorta: The aortic root is normal in size and structure. Venous: The inferior vena cava is normal in size with greater than 50% respiratory variability, suggesting right atrial pressure of 3 mmHg. IAS/Shunts: No atrial level shunt detected by color flow Doppler.  LEFT VENTRICLE PLAX  2D LVIDd:         4.29 cm  Diastology LVIDs:         2.21 cm  LV e' lateral:   8.38 cm/s LV PW:         0.90 cm  LV E/e' lateral: 10.4 LV IVS:        0.94 cm  LV e' medial:    5.55 cm/s LVOT diam:     2.00 cm  LV E/e' medial:  15.6 LV SV:         48 LV SV Index:   27 LVOT Area:     3.14 cm  LEFT ATRIUM            Index       RIGHT ATRIUM           Index LA diam:      4.80 cm  2.70 cm/m  RA Area:     15.30 cm LA Vol (A2C): 99.0 ml  55.69 ml/m RA Volume:   30.00 ml  16.87 ml/m LA Vol (A4C): 135.0 ml 75.93 ml/m  AORTIC VALVE                   PULMONIC VALVE AV Area (Vmax):    1.81 cm    PV Vmax:        0.63 m/s AV Area (Vmean):   1.92 cm    PV Peak grad:   1.6 mmHg AV Area (VTI):     2.05 cm    RVOT Peak grad: 2 mmHg AV Vmax:           100.17 cm/s AV Vmean:          68.400 cm/s AV VTI:            0.234 m AV Peak Grad:      4.0 mmHg AV Mean Grad:      2.3 mmHg LVOT Vmax:         57.60 cm/s LVOT Vmean:        41.900 cm/s LVOT VTI:          0.153 m LVOT/AV VTI ratio: 0.65  AORTA Ao Root diam: 3.20 cm MITRAL VALVE               TRICUSPID VALVE MV Area (PHT): 5.13 cm    TR Peak grad:   37.9 mmHg MV Decel Time: 148 msec    TR Vmax:        308.00 cm/s MV E velocity: 86.80 cm/s MV A velocity: 36.90 cm/s  SHUNTS MV E/A ratio:  2.35         Systemic VTI:  0.15 m                            Systemic Diam: 2.00 cm Neoma Laming MD Electronically signed by Neoma Laming MD Signature Date/Time: 03/23/2020/9:07:23 AM    Final    DG HIP OPERATIVE UNILAT W OR W/O PELVIS LEFT  Result Date: 03/22/2020 CLINICAL DATA:  Intramedullary nail left hip. EXAM: OPERATIVE LEFT HIP (WITH PELVIS IF PERFORMED) 21 VIEWS TECHNIQUE: Fluoroscopic spot image(s) were submitted for interpretation post-operatively. COMPARISON:  03/21/2020 FINDINGS: Examination demonstrates fixation patient's left intertrochanteric fracture with intramedullary nail and associated bridging screw into the femoral head. Hardware is intact as there is near anatomic alignment over the fracture site. IMPRESSION: Post fixation of left femoral intertrochanteric fracture with hardware intact and near anatomic alignment over the  fracture site. Electronically Signed   By: Marin Olp M.D.   On: 03/22/2020 14:34   DG HIP UNILAT WITH PELVIS 2-3 VIEWS LEFT  Result Date: 03/21/2020 CLINICAL DATA:  Pt arrives via [facility] from home after attempting to get out of the car and falling back into the car. PT has shortening of left leg and c/o bottom pain on left side. Pt unable to bear weight on the left leg. Left leg is foreshortened; Pre op EXAM: DG HIP (WITH OR WITHOUT PELVIS) 2-3V LEFT COMPARISON:  None. FINDINGS: Intertrochanteric fracture of the proximal left femur. Primary fracture components are well aligned with minimal displacement and no significant angulation. There are separate fracture components of the lesser and greater trochanters. No other fractures.  No bone lesions. Hip joints, SI joints and symphysis pubis are normally aligned. Skeletal structures are diffusely demineralized. IMPRESSION: 1. Comminuted intertrochanteric fracture of the proximal left femur with no significant displacement or angulation. Electronically Signed   By: Lajean Manes M.D.   On: 03/21/2020 15:38   DG FEMUR PORT MIN 2  VIEWS LEFT  Result Date: 03/22/2020 CLINICAL DATA:  Status post intramedullary rod fixation of the left femur. EXAM: LEFT FEMUR PORTABLE 2 VIEWS COMPARISON:  Fluoroscopic images of same day. FINDINGS: Status post intramedullary rod fixation of proximal left femoral intertrochanteric fracture. Good alignment of fracture components is noted. Expected postoperative changes are noted in the surrounding soft tissues. Vascular calcifications are noted. IMPRESSION: Status post intramedullary rod fixation of proximal left femoral intertrochanteric fracture. Electronically Signed   By: Marijo Conception M.D.   On: 03/22/2020 16:08   ASSESSMENT AND PLAN:  Timathy Mixell  is a 86 y.o. male with a known history of Alzheimer's dementia, hyperlipidemia, hypertension comes to the emergency room after patient had a mechanical fall while getting out of the car fell backwards into the car and started having left hip pain.  Acute left proximal intertrochanteric femur fracture status post mechanical fall -admit to orthopedic floor -DR. KRASINSKI's input aprreciated -POD #1 -PRN IV and PO pain meds -PT//OT put noted. Patient will require home health PT and 24 hour assistance -DVT prophylaxis Lovenox -discontinue IV fluids  Postop anemia -hemoglobin 11.4--- 7.3--- one unit blood transfusion  Abnormal EKG-- atrial flutter -patient asymptomatic -no cardiac history--- this was confirmed with patient's daughter Meta Hatchet -Dr Humphrey Rolls consulted appreciate input -Echo EF 50-55% mild RA/LA mild LV global hypokinesis  Hypertension bp stable -not on any home bp meds  Dementia-- chronic resume Namenda, depakote and lexapro patient has 24x7 caregivers at home.  Hyperlipidemia Resume statins  BPH on Flomax  Gerd continue PPI   Family Communication : spoke with son Mr Peiffer at bedside Consults : orthopedic dr Mack Guise Code Status : DNR prior to admission DVT prophylaxis :  Lovenox discharge  disposition: home with home health tomorrow if patient continues to show improvement  TOTAL TIME TAKING CARE OF THIS PATIENT: *25* minutes.  >50% time spent on counselling and coordination of care  Note: This dictation was prepared with Dragon dictation along with smaller phrase technology. Any transcriptional errors that result from this process are unintentional.  Fritzi Mandes M.D    [facility]   CC: Primary care physician; No primary care provider on file.Patient ID: KAIRON PELOSO, male   DOB: [birth year], 86 y.o.   MRN: [id number]

## 2020-03-23 NOTE — Progress Notes (Signed)
SUBJECTIVE: Patient resting comfortably in bed. Patient has increased altered mental status this morning, most likely d/t surgery surgery. Son and caregiver at bedside. No acute events overnight.   Vitals:   03/22/20 2057 03/23/20 0057 03/23/20 0504 03/23/20 0725  BP: (!) 97/56 (!) 109/47 (!) 112/45 (!) 119/57  Pulse: 70 70 72 85  Resp: 18 20 16 18   Temp: 98.4 F (36.9 C) 97.8 F (36.6 C) 97.9 F (36.6 C) 98.6 F (37 C)  TempSrc:   Oral Oral  SpO2: 99% 100% 97% 98%  Weight:      Height:        Intake/Output Summary (Last 24 hours) at 03/23/2020 [id number] Last data filed at 03/23/2020 0300 Gross per 24 hour  Intake 350 ml  Output 810 ml  Net -460 ml    LABS: Basic Metabolic Panel: Recent Labs    03/21/20 1435 03/23/20 0541  NA 139 139  K 4.5 4.4  CL 104 107  CO2 25 26  GLUCOSE 107* 123*  BUN 30* 28*  CREATININE 0.89 0.73  CALCIUM 9.1 8.2*   Liver Function Tests: Recent Labs    03/21/20 1435  AST 31  ALT 19  ALKPHOS 68  BILITOT 0.8  PROT 5.9*  ALBUMIN 3.5   No results for input(s): LIPASE, AMYLASE in the last 72 hours. CBC: Recent Labs    03/21/20 1435 03/23/20 0541  WBC 6.3 6.8  HGB 11.4* 7.3*  HCT 34.2* 22.1*  MCV 98.0 99.1  PLT 168 127*   Cardiac Enzymes: No results for input(s): CKTOTAL, CKMB, CKMBINDEX, TROPONINI in the last 72 hours. BNP: Invalid input(s): POCBNP D-Dimer: No results for input(s): DDIMER in the last 72 hours. Hemoglobin A1C: No results for input(s): HGBA1C in the last 72 hours. Fasting Lipid Panel: No results for input(s): CHOL, HDL, LDLCALC, TRIG, CHOLHDL, LDLDIRECT in the last 72 hours. Thyroid Function Tests: No results for input(s): TSH, T4TOTAL, T3FREE, THYROIDAB in the last 72 hours.  Invalid input(s): FREET3 Anemia Panel: No results for input(s): VITAMINB12, FOLATE, FERRITIN, TIBC, IRON, RETICCTPCT in the last 72 hours.   PHYSICAL EXAM General: Well developed, well nourished, in no acute distress HEENT:   Normocephalic and atramatic Neck:  No JVD.  Lungs: Clear bilaterally to auscultation and percussion. Heart: HRRR . Normal S1 and S2 without gallops or murmurs.  Abdomen: Bowel sounds are positive, abdomen soft and non-tender  Msk:  Back normal, normal gait. Normal strength and tone for age. Extremities: No clubbing, cyanosis or edema.   Neuro: Alert and oriented X 3. Psych:  AMS  TELEMETRY: NSR 70s  ASSESSMENT AND PLAN: Patient given surgical clearance yesterday and had intramedullary fixation of his left intertrochanteric hip w/o complication. Patient converted from atrial flutter into normal sinus rhythm overnight. Hemodynamically stable. Echocardiogram complete and we will place recommendations once read if there are any abnormalities. With patient's age and recent surgery will continue to hold anticoagulation. Patient currently stable and as long as he continues to be rate controlled if he converts back to atrial flutter we will plan on continuing to see the patient in the outpatient setting.     Active Problems:   Intertrochanteric fracture of left femur ([place])    Adaline Sill, NP-C 03/23/2020 9:22 AM

## 2020-03-23 NOTE — Progress Notes (Signed)
New referral for [facility] community [facility]iative to follow at home received from [facility]. Patient information given to referral. Possible discharge this weekend, patient will be followed by [facility] home [facility]. Thank you for this referral. Flo Shanks BSN, RN, [place] [phone]

## 2020-03-23 NOTE — Evaluation (Signed)
Physical Therapy Evaluation Patient Details Name: Travis Mccoy MRN: [id number] DOB: May 17, 1922 Today's Date: 03/23/2020   History of Present Illness  Pt admitted for complaints of fall getting out of car and now with L hip fx. Pt is POD 1 s/p IM nailing. HIstory includes Alzheimers and HTN.  Clinical Impression  Pt is a pleasant 86 year old male who was admitted for L LE IM nailing. Pt performs bed mobility with max assist and transfers +2 max assist with use of RW. Attempted ambulation over to recliner, unable to take more than 1-2 steps without fatigue. SPT over to recliner with max assist +2. Pt confused, however per family is confused at baseline. Pt demonstrates deficits with strength/mobility/cognition. Has great family support for transition home. Would benefit from skilled PT to address above deficits and promote optimal return to PLOF. Recommend transition to [place] upon discharge from acute hospitalization.  Patient suffers from L hip IM nailing which impairs his ability to perform daily activities like toileting, feeding, dressing, grooming, bathing in the home. A cane, walker, crutch will not resolve the patient's issue with performing activities of daily living. A lightweight wheelchair is required/recommended and will allow patient to safely perform daily activities.   Patient can safely propel the wheelchair in the home or has a caregiver who can provide assistance.       Follow Up Recommendations Home health PT;Supervision/Assistance - 24 hour    Equipment Recommendations  Rolling walker with 5" wheels;3in1 (PT);Wheelchair (measurements PT);Hospital bed    Recommendations for Other Services       Precautions / Restrictions Precautions Precautions: Fall Restrictions Weight Bearing Restrictions: Yes LLE Weight Bearing: Weight bearing as tolerated      Mobility  Bed Mobility Overal bed mobility: Needs Assistance Bed Mobility: Supine to Sit     Supine to sit: Max  assist     General bed mobility comments: needs assist for sequencing. Once seated able to sit with forward flexed posture decreasing to cga.  Transfers Overall transfer level: Needs assistance Equipment used: Rolling walker (2 wheeled) Transfers: Sit to/from Stand Sit to Stand: Max assist;+2 physical assistance;+2 safety/equipment;From elevated surface         General transfer comment: transfers performed with forward flexed posture with cues for upright posture.  Ambulation/Gait Ambulation/Gait assistance: Max assist;+2 physical assistance Gait Distance (Feet): 2 Feet Assistive device: Rolling walker (2 wheeled) Gait Pattern/deviations: Step-to pattern     General Gait Details: only able to take 1-2 steps prior to fatigue. Returned seated on bed and SPT to recliner with max assist +2  Stairs            Wheelchair Mobility    Modified Rankin (Stroke Patients Only)       Balance Overall balance assessment: History of Falls;Needs assistance Sitting-balance support: Feet supported;Bilateral upper extremity supported Sitting balance-Leahy Scale: Fair     Standing balance support: Bilateral upper extremity supported Standing balance-Leahy Scale: Poor                               Pertinent Vitals/Pain Pain Assessment: Faces Faces Pain Scale: Hurts little more Pain Location: L hip, appears to improve with rest Pain Descriptors / Indicators: Operative site guarding Pain Intervention(s): Limited activity within patient's tolerance;Patient requesting pain meds-RN notified;Repositioned    Home Living Family/patient expects to be discharged to:: Private residence Living Arrangements: Alone Available Help at Discharge: Available 24 hours/day;Personal care attendant Type of  Home: House Home Access: Stairs to enter Entrance Stairs-Rails: Right Entrance Stairs-Number of Steps: 4 Home Layout: One level Home Equipment: Cane - single point      Prior  Function Level of Independence: Independent with assistive device(s)         Comments: uses SPC at baseline, active and like to go shopping with his caregivers. They report multiple falls.     Hand Dominance        Extremity/Trunk Assessment   Upper Extremity Assessment Upper Extremity Assessment: Generalized weakness(B UE grossly 3+/5)    Lower Extremity Assessment Lower Extremity Assessment: Generalized weakness(L LE grossly 2/5; R LE grossly 3/5)       Communication   Communication: No difficulties  Cognition Arousal/Alertness: Awake/alert Behavior During Therapy: WFL for tasks assessed/performed Overall Cognitive Status: History of cognitive impairments - at baseline                                        General Comments      Exercises Other Exercises Other Exercises: supine ther-ex performed on L LE with cues for sequencing including SLR and hip abd/add. 10 reps with max assist   Assessment/Plan    PT Assessment Patient needs continued PT services  PT Problem List Decreased strength;Decreased activity tolerance;Decreased balance;Decreased mobility;Pain;Decreased cognition       PT Treatment Interventions Gait training;Therapeutic exercise;Balance training;DME instruction    PT Goals (Current goals can be found in the Care Plan section)  Acute Rehab PT Goals Patient Stated Goal: unable to state PT Goal Formulation: Patient unable to participate in goal setting Time For Goal Achievement: 04/06/20 Potential to Achieve Goals: Good    Frequency BID   Barriers to discharge        Co-evaluation               AM-PAC PT "6 Clicks" Mobility  Outcome Measure Help needed turning from your back to your side while in a flat bed without using bedrails?: A Lot Help needed moving from lying on your back to sitting on the side of a flat bed without using bedrails?: A Lot Help needed moving to and from a bed to a chair (including a  wheelchair)?: Total Help needed standing up from a chair using your arms (e.g., wheelchair or bedside chair)?: Total Help needed to walk in hospital room?: Total Help needed climbing 3-5 steps with a railing? : Total 6 Click Score: 8    End of Session Equipment Utilized During Treatment: Gait belt Activity Tolerance: Patient tolerated treatment well Patient left: in chair;with chair alarm set Nurse Communication: Mobility status PT Visit Diagnosis: Muscle weakness (generalized) (M62.81);Difficulty in walking, not elsewhere classified (R26.2);Pain;Unsteadiness on feet (R26.81) Pain - Right/Left: Left Pain - part of body: Hip    Time: 1010-1050 PT Time Calculation (min) (ACUTE ONLY): 40 min   Charges:   PT Evaluation $PT Eval Moderate Complexity: 1 Mod PT Treatments $Therapeutic Exercise: 23-37 mins        Greggory Stallion, PT, DPT [phone]   Amabel Stmarie 03/23/2020, 12:08 PM

## 2020-03-23 NOTE — Progress Notes (Signed)
Subjective:  POD #1 s/p IM fixation for left intertrochanteric hip fracture.  Patient is confused.  He is wearing mitts to prevent him from pulling out his IV.  Patient reports he is having left hip pain.  His son and his caretaker at the bedside.  Patient is up out of bed to a chair.  He is trying to pull off his meds.  Objective:   VITALS:   Vitals:   03/23/20 0725 03/23/20 1119 03/23/20 1256 03/23/20 1315  BP: (!) 119/57 (!) 112/40 (!) 113/42 (!) 137/50  Pulse: 85 71 72 72  Resp: 18 18  18   Temp: 98.6 F (37 C) 98.8 F (37.1 C) 98 F (36.7 C) 98.2 F (36.8 C)  TempSrc: Oral Oral Oral Oral  SpO2: 98% 100% 92% 99%  Weight:      Height:        PHYSICAL EXAM: Left lower extremity: Neurovascular intact Sensation intact distally Intact pulses distally Dorsiflexion/Plantar flexion intact Incision: dressing C/D/I No cellulitis present Compartment soft  LABS  Results for orders placed or performed during the hospital encounter of 03/21/20 (from the past 24 hour(s))  CBC     Status: Abnormal   Collection Time: 03/23/20  5:41 AM  Result Value Ref Range   WBC 6.8 4.0 - 10.5 K/uL   RBC 2.23 (L) 4.22 - 5.81 MIL/uL   Hemoglobin 7.3 (L) 13.0 - 17.0 g/dL   HCT 22.1 (L) 39.0 - 52.0 %   MCV 99.1 80.0 - 100.0 fL   MCH 32.7 26.0 - 34.0 pg   MCHC 33.0 30.0 - 36.0 g/dL   RDW 14.3 11.5 - 15.5 %   Platelets 127 (L) 150 - 400 K/uL   nRBC 0.0 0.0 - 0.2 %  Basic metabolic panel     Status: Abnormal   Collection Time: 03/23/20  5:41 AM  Result Value Ref Range   Sodium 139 135 - 145 mmol/L   Potassium 4.4 3.5 - 5.1 mmol/L   Chloride 107 98 - 111 mmol/L   CO2 26 22 - 32 mmol/L   Glucose, Bld 123 (H) 70 - 99 mg/dL   BUN 28 (H) 8 - 23 mg/dL   Creatinine, Ser 0.73 0.61 - 1.24 mg/dL   Calcium 8.2 (L) 8.9 - 10.3 mg/dL   GFR calc non Af Amer >60 >60 mL/min   GFR calc Af Amer >60 >60 mL/min   Anion gap 6 5 - 15  ABO/Rh     Status: None   Collection Time: 03/23/20  5:41 AM  Result  Value Ref Range   ABO/RH(D)      O POS Performed at [facility], [address] [place], [place] [address]   Prepare RBC (crossmatch)     Status: None   Collection Time: 03/23/20 11:05 AM  Result Value Ref Range   Order Confirmation      ORDER PROCESSED BY BLOOD BANK Performed at [facility], [address], [place], [place] [address]     DG Chest 1 View  Result Date: 03/21/2020 CLINICAL DATA:  Pt arrives via [facility] from home after attempting to get out of the car and falling back into the car. PT has shortening of left leg and c/o bottom pain on left side. Pt unable to bear weight on the left leg. Left leg is foreshortened; EXAM: CHEST  1 VIEW COMPARISON:  03/14/2014 FINDINGS: Cardiac silhouette is normal in size. No mediastinal or hilar masses. No evidence of adenopathy. Mild linear scarring  or atelectasis the lung bases, greater on the left. Lungs are otherwise clear. No convincing pleural effusion and no pneumothorax. Skeletal structures are demineralized but grossly intact. IMPRESSION: No active disease. Electronically Signed   By: Lajean Manes M.D.   On: 03/21/2020 15:37   ECHOCARDIOGRAM COMPLETE  Result Date: 03/23/2020    ECHOCARDIOGRAM REPORT   Patient Name:   Travis Mccoy Date of Exam: 03/22/2020 Medical Rec #:  [id number]     Height:       67.0 in Accession #:    [id number]    Weight:       147.7 lb Date of Birth:  04/19/1922     BSA:          1.778 m Patient Age:    86 years      BP:           93/59 mmHg Patient Gender: M             HR:           67 bpm. Exam Location:  [facility] Procedure: 2D Echo, Cardiac Doppler and Color Doppler Indications:     Atrial Flutter 427.32  History:         Patient has no prior history of Echocardiogram examinations.                  Risk Factors:Hypertension.  Sonographer:     Sherrie Sport RDCS (AE) Referring Phys:  [place] Diagnosing Phys: Neoma Laming MD IMPRESSIONS  1. Left ventricular ejection fraction, by estimation, is 50  to 55%. The left ventricle has low normal function. The left ventricle demonstrates global hypokinesis. The left ventricular internal cavity size was moderately dilated. Left ventricular diastolic parameters are indeterminate.  2. Right ventricular systolic function is normal. The right ventricular size is normal. There is moderately elevated pulmonary artery systolic pressure.  3. Left atrial size was moderately dilated.  4. Right atrial size was moderately dilated.  5. The mitral valve is normal in structure. Mild mitral valve regurgitation. No evidence of mitral stenosis.  6. The tricuspid valve is myxomatous. Tricuspid valve regurgitation is mild to moderate.  7. The aortic valve is normal in structure. Aortic valve regurgitation is trivial. Mild aortic valve sclerosis is present, with no evidence of aortic valve stenosis.  8. The inferior vena cava is normal in size with greater than 50% respiratory variability, suggesting right atrial pressure of 3 mmHg. FINDINGS  Left Ventricle: Left ventricular ejection fraction, by estimation, is 50 to 55%. The left ventricle has low normal function. The left ventricle demonstrates global hypokinesis. The left ventricular internal cavity size was moderately dilated. There is no left ventricular hypertrophy. Left ventricular diastolic parameters are indeterminate. Right Ventricle: The right ventricular size is normal. No increase in right ventricular wall thickness. Right ventricular systolic function is normal. There is moderately elevated pulmonary artery systolic pressure. The tricuspid regurgitant velocity is 3.08 m/s, and with an assumed right atrial pressure of 10 mmHg, the estimated right ventricular systolic pressure is [id number] mmHg. Left Atrium: Left atrial size was moderately dilated. Right Atrium: Right atrial size was moderately dilated. Pericardium: There is no evidence of pericardial effusion. Mitral Valve: The mitral valve is normal in structure. Normal mobility  of the mitral valve leaflets. Mild mitral valve regurgitation. No evidence of mitral valve stenosis. Tricuspid Valve: The tricuspid valve is myxomatous. Tricuspid valve regurgitation is mild to moderate. No evidence of tricuspid stenosis. Aortic Valve: The aortic valve is  normal in structure. Aortic valve regurgitation is trivial. Mild aortic valve sclerosis is present, with no evidence of aortic valve stenosis. Aortic valve mean gradient measures 2.3 mmHg. Aortic valve peak gradient measures 4.0 mmHg. Aortic valve area, by VTI measures 2.05 cm. Pulmonic Valve: The pulmonic valve was normal in structure. Pulmonic valve regurgitation is not visualized. No evidence of pulmonic stenosis. Aorta: The aortic root is normal in size and structure. Venous: The inferior vena cava is normal in size with greater than 50% respiratory variability, suggesting right atrial pressure of 3 mmHg. IAS/Shunts: No atrial level shunt detected by color flow Doppler.  LEFT VENTRICLE PLAX 2D LVIDd:         4.29 cm  Diastology LVIDs:         2.21 cm  LV e' lateral:   8.38 cm/s LV PW:         0.90 cm  LV E/e' lateral: 10.4 LV IVS:        0.94 cm  LV e' medial:    5.55 cm/s LVOT diam:     2.00 cm  LV E/e' medial:  15.6 LV SV:         48 LV SV Index:   27 LVOT Area:     3.14 cm  LEFT ATRIUM            Index       RIGHT ATRIUM           Index LA diam:      4.80 cm  2.70 cm/m  RA Area:     15.30 cm LA Vol (A2C): 99.0 ml  55.69 ml/m RA Volume:   30.00 ml  16.87 ml/m LA Vol (A4C): 135.0 ml 75.93 ml/m  AORTIC VALVE                   PULMONIC VALVE AV Area (Vmax):    1.81 cm    PV Vmax:        0.63 m/s AV Area (Vmean):   1.92 cm    PV Peak grad:   1.6 mmHg AV Area (VTI):     2.05 cm    RVOT Peak grad: 2 mmHg AV Vmax:           100.17 cm/s AV Vmean:          68.400 cm/s AV VTI:            0.234 m AV Peak Grad:      4.0 mmHg AV Mean Grad:      2.3 mmHg LVOT Vmax:         57.60 cm/s LVOT Vmean:        41.900 cm/s LVOT VTI:          0.153 m  LVOT/AV VTI ratio: 0.65  AORTA Ao Root diam: 3.20 cm MITRAL VALVE               TRICUSPID VALVE MV Area (PHT): 5.13 cm    TR Peak grad:   37.9 mmHg MV Decel Time: 148 msec    TR Vmax:        308.00 cm/s MV E velocity: 86.80 cm/s MV A velocity: 36.90 cm/s  SHUNTS MV E/A ratio:  2.35        Systemic VTI:  0.15 m                            Systemic Diam: 2.00 cm Neoma Laming  MD Electronically signed by Neoma Laming MD Signature Date/Time: 03/23/2020/9:07:23 AM    Final    DG HIP OPERATIVE UNILAT W OR W/O PELVIS LEFT  Result Date: 03/22/2020 CLINICAL DATA:  Intramedullary nail left hip. EXAM: OPERATIVE LEFT HIP (WITH PELVIS IF PERFORMED) 21 VIEWS TECHNIQUE: Fluoroscopic spot image(s) were submitted for interpretation post-operatively. COMPARISON:  03/21/2020 FINDINGS: Examination demonstrates fixation patient's left intertrochanteric fracture with intramedullary nail and associated bridging screw into the femoral head. Hardware is intact as there is near anatomic alignment over the fracture site. IMPRESSION: Post fixation of left femoral intertrochanteric fracture with hardware intact and near anatomic alignment over the fracture site. Electronically Signed   By: Marin Olp M.D.   On: 03/22/2020 14:34   DG HIP UNILAT WITH PELVIS 2-3 VIEWS LEFT  Result Date: 03/21/2020 CLINICAL DATA:  Pt arrives via [facility] from home after attempting to get out of the car and falling back into the car. PT has shortening of left leg and c/o bottom pain on left side. Pt unable to bear weight on the left leg. Left leg is foreshortened; Pre op EXAM: DG HIP (WITH OR WITHOUT PELVIS) 2-3V LEFT COMPARISON:  None. FINDINGS: Intertrochanteric fracture of the proximal left femur. Primary fracture components are well aligned with minimal displacement and no significant angulation. There are separate fracture components of the lesser and greater trochanters. No other fractures.  No bone lesions. Hip joints, SI joints and symphysis pubis are  normally aligned. Skeletal structures are diffusely demineralized. IMPRESSION: 1. Comminuted intertrochanteric fracture of the proximal left femur with no significant displacement or angulation. Electronically Signed   By: Lajean Manes M.D.   On: 03/21/2020 15:38   DG FEMUR PORT MIN 2 VIEWS LEFT  Result Date: 03/22/2020 CLINICAL DATA:  Status post intramedullary rod fixation of the left femur. EXAM: LEFT FEMUR PORTABLE 2 VIEWS COMPARISON:  Fluoroscopic images of same day. FINDINGS: Status post intramedullary rod fixation of proximal left femoral intertrochanteric fracture. Good alignment of fracture components is noted. Expected postoperative changes are noted in the surrounding soft tissues. Vascular calcifications are noted. IMPRESSION: Status post intramedullary rod fixation of proximal left femoral intertrochanteric fracture. Electronically Signed   By: Marijo Conception M.D.   On: 03/22/2020 16:08    Assessment/Plan: 1 Day Post-Op   Active Problems:   Intertrochanteric fracture of left femur ([place])  I had a lengthy conversation with the patient's son.  I explained to him the details of what was done surgically.  We discussed the postoperative course.  We also discussed expectations in the setting of his father's dementia.  Patient will continue with physical therapy as tolerated for lower extremity strengthening, hip range of motion and gait training.  He may weight-bear as tolerated on the left lower extremity.  Patient should continue Lovenox 40 mg daily for DVT prophylaxis x2 weeks.  He should follow-up in my office in 10 to 14 days for wound check, staple removal and x-ray.  I answered all questions by the patient's son.  The family's plan is to take the patient home with home health PT.  The son indicates they can provide 24-hour care for the patient at home.    Thornton Park , MD 03/23/2020, 1:43 PM

## 2020-03-23 NOTE — Progress Notes (Signed)
Physical Therapy Treatment Patient Details Name: Travis Mccoy MRN: [id number] DOB: 06-01-1922 Today's Date: 03/23/2020    History of Present Illness Pt admitted for complaints of fall getting out of car and now with L hip fx. Pt is POD 1 s/p IM nailing. HIstory includes Alzheimers and HTN.    PT Comments    Pt is more confused this PM, trying to pull off mitts. RN in room ready to start blood transfusion. Perform SPT to bed prior to transfusion. Pt very sleepy once in bed, further there-ex deferred at this time. Explained to family that he will need +2 assist for all mobility at home until he has physically/cognitively improved. Will continue to progress as able.  Follow Up Recommendations  Home health PT;Supervision/Assistance - 24 hour     Equipment Recommendations  Rolling walker with 5" wheels;3in1 (PT);Wheelchair (measurements PT);Hospital bed    Recommendations for Other Services       Precautions / Restrictions Precautions Precautions: Fall Restrictions Weight Bearing Restrictions: Yes LLE Weight Bearing: Weight bearing as tolerated    Mobility  Bed Mobility Overal bed mobility: Needs Assistance Bed Mobility: Sit to Supine     Supine to sit: Max assist Sit to supine: Max assist   General bed mobility comments: +2 required for returning supine and repositioning in bed. Pt more confused this PM, unable to assist  Transfers Overall transfer level: Needs assistance Equipment used: Rolling walker (2 wheeled) Transfers: Sit to/from Stand Sit to Stand: Max assist;+2 physical assistance         General transfer comment: pt able to lean forward prior to transfer and hold onto therapist. +2 for SPT from recliner->bed. Pt grimaces in pain with all mobility  Ambulation/Gait Ambulation/Gait assistance: Max assist;+2 physical assistance Gait Distance (Feet): 2 Feet Assistive device: Rolling walker (2 wheeled) Gait Pattern/deviations: Step-to pattern     General  Gait Details: not able to perform   Stairs             Wheelchair Mobility    Modified Rankin (Stroke Patients Only)       Balance Overall balance assessment: History of Falls;Needs assistance Sitting-balance support: Feet supported;Bilateral upper extremity supported Sitting balance-Leahy Scale: Fair     Standing balance support: Bilateral upper extremity supported Standing balance-Leahy Scale: Poor                              Cognition Arousal/Alertness: Awake/alert Behavior During Therapy: WFL for tasks assessed/performed Overall Cognitive Status: History of cognitive impairments - at baseline                                        Exercises Other Exercises Other Exercises: deferred as pt very fatigued/lethargic after mobility    General Comments        Pertinent Vitals/Pain Pain Assessment: Faces Faces Pain Scale: Hurts even more Pain Location: L hip, appears to improve with rest Pain Descriptors / Indicators: Operative site guarding Pain Intervention(s): Limited activity within patient's tolerance;Repositioned    Home Living Family/patient expects to be discharged to:: Private residence Living Arrangements: Alone Available Help at Discharge: Available 24 hours/day;Personal care attendant Type of Home: House Home Access: Stairs to enter Entrance Stairs-Rails: Right Home Layout: One level Home Equipment: Cane - single point      Prior Function Level of Independence: Independent with assistive device(s)  Comments: uses SPC at baseline, active and like to go shopping with his caregivers. They report multiple falls.   PT Goals (current goals can now be found in the care plan section) Acute Rehab PT Goals Patient Stated Goal: unable to state PT Goal Formulation: Patient unable to participate in goal setting Time For Goal Achievement: 04/06/20 Potential to Achieve Goals: Fair Additional Goals Additional Goal #1: Pt  will be able to perform bed mobility/transfers with min assist and safe technique in order to improve functional independence Progress towards PT goals: Progressing toward goals    Frequency    BID      PT Plan Current plan remains appropriate    Co-evaluation              AM-PAC PT "6 Clicks" Mobility   Outcome Measure  Help needed turning from your back to your side while in a flat bed without using bedrails?: A Lot Help needed moving from lying on your back to sitting on the side of a flat bed without using bedrails?: A Lot Help needed moving to and from a bed to a chair (including a wheelchair)?: Total Help needed standing up from a chair using your arms (e.g., wheelchair or bedside chair)?: Total Help needed to walk in hospital room?: Total Help needed climbing 3-5 steps with a railing? : Total 6 Click Score: 8    End of Session Equipment Utilized During Treatment: Gait belt Activity Tolerance: Patient limited by fatigue;Patient limited by pain Patient left: in bed;with bed alarm set(left with RN to start blood transfusion) Nurse Communication: Mobility status PT Visit Diagnosis: Muscle weakness (generalized) (M62.81);Difficulty in walking, not elsewhere classified (R26.2);Pain;Unsteadiness on feet (R26.81) Pain - Right/Left: Left Pain - part of body: Hip     Time: [id number] PT Time Calculation (min) (ACUTE ONLY): 11 min  Charges:  $Therapeutic Exercise: 23-37 mins $Therapeutic Activity: 8-22 mins                     Greggory Stallion, PT, DPT [phone]    Rekia Kujala 03/23/2020, 2:26 PM

## 2020-03-23 NOTE — Care Management Important Message (Signed)
Important Message  Patient Details  Name: Travis Mccoy MRN: [id number] Date of Birth: 1922-05-31   Medicare Important Message Given:  N/A - LOS <3 / Initial given by admissions     Juliann Pulse A Bradly Sangiovanni 03/23/2020, 8:03 AM

## 2020-03-23 NOTE — Progress Notes (Signed)
Patient has 298 ml urine in urinary bladder revealed via Bladder scan. Patients son asked for UA order put in . This Probation officer straight cath patient with 14 FR in/ out catheter . 300CC of straw yellow urine obtained and sent to lab at 1730. Late entry

## 2020-03-23 NOTE — Anesthesia Postprocedure Evaluation (Signed)
Anesthesia Post Note  Patient: Travis Mccoy  Procedure(s) Performed: INTRAMEDULLARY (IM) NAIL INTERTROCHANTRIC (Left )  Patient location during evaluation: Nursing Unit Anesthesia Type: Spinal Level of consciousness: awake, awake and alert and oriented Respiratory status: spontaneous breathing, nonlabored ventilation and respiratory function stable Cardiovascular status: stable Postop Assessment: patient able to bend at knees, no apparent nausea or vomiting and adequate PO intake Anesthetic complications: no     Last Vitals:  Vitals:   03/23/20 0504 03/23/20 0725  BP: (!) 112/45 (!) 119/57  Pulse: 72 85  Resp: 16 18  Temp: 36.6 C 37 C  SpO2: 97% 98%    Last Pain:  Vitals:   03/23/20 0725  TempSrc: Oral  PainSc:                  Ricki Miller

## 2020-03-24 DIAGNOSIS — Z8781 Personal history of (healed) traumatic fracture: Secondary | ICD-10-CM

## 2020-03-24 LAB — BASIC METABOLIC PANEL
Anion gap: 8 (ref 5–15)
BUN: 28 mg/dL — ABNORMAL HIGH (ref 8–23)
CO2: 24 mmol/L (ref 22–32)
Calcium: 8.2 mg/dL — ABNORMAL LOW (ref 8.9–10.3)
Chloride: 105 mmol/L (ref 98–111)
Creatinine, Ser: 0.67 mg/dL (ref 0.61–1.24)
GFR calc Af Amer: >60 mL/min (ref >60)
GFR calc non Af Amer: >60 mL/min (ref >60)
Glucose, Bld: 123 mg/dL — ABNORMAL HIGH (ref 70–99)
Potassium: 4.2 mmol/L (ref 3.5–5.1)
Sodium: 137 mmol/L (ref 135–145)

## 2020-03-24 LAB — CBC
HCT: 24.5 % — ABNORMAL LOW (ref 39.0–52.0)
Hemoglobin: 8 g/dL — ABNORMAL LOW (ref 13.0–17.0)
MCH: 32 pg (ref 26.0–34.0)
MCHC: 32.7 g/dL (ref 30.0–36.0)
MCV: 98 fL (ref 80.0–100.0)
Platelets: 127 10*3/uL — ABNORMAL LOW (ref 150–400)
RBC: 2.5 MIL/uL — ABNORMAL LOW (ref 4.22–5.81)
RDW: 14.1 % (ref 11.5–15.5)
WBC: 8 10*3/uL (ref 4.0–10.5)
nRBC: 0 % (ref 0.0–0.2)

## 2020-03-24 MED ORDER — TAMSULOSIN HCL 0.4 MG PO CAPS
0.4000 mg | ORAL_CAPSULE | Freq: Every day | ORAL | Status: DC
  Filled 2020-03-24: qty 1

## 2020-03-24 MED ORDER — ENOXAPARIN SODIUM 40 MG/0.4ML ~~LOC~~ SOLN: 40.0000 mg | SUBCUTANEOUS | 0 refills | Status: AC

## 2020-03-24 MED ORDER — ENSURE ENLIVE PO LIQD: 237.0000 mL | Freq: Two times a day (BID) | ORAL | 12 refills | Status: AC

## 2020-03-24 MED ORDER — TAMSULOSIN HCL 0.4 MG PO CAPS: 0.4000 mg | ORAL_CAPSULE | Freq: Every day | ORAL | 0 refills | Status: AC

## 2020-03-24 MED ORDER — TRAMADOL HCL 50 MG PO TABS: 50.0000 mg | ORAL_TABLET | Freq: Two times a day (BID) | ORAL | 0 refills | Status: DC | PRN

## 2020-03-24 NOTE — Discharge Summary (Signed)
[facility] [place] NAME: Travis Mccoy    Travis#:  [id number]  DATE OF BIRTH:  [birth year]  DATE OF ADMISSION:  03/21/2020 ADMITTING PHYSICIAN: Fritzi Mandes, MD  DATE OF DISCHARGE: 03/24/2020  PRIMARY CARE PHYSICIAN: No primary care provider on file.    ADMISSION DIAGNOSIS:  Hip pain [M25.559] Intertrochanteric fracture of left femur (HCC) [S72.142A] Closed fracture of left hip, initial encounter ([place]) [S72.002A]  DISCHARGE DIAGNOSIS:  Intertrochanteric fracture left femur status post surgery postoperative anemia status post one unit blood transfusion SECONDARY DIAGNOSIS:   Past Medical History:  Diagnosis Date  . GERD (gastroesophageal reflux disease)   . Hyperlipidemia   . Hypertension     HOSPITAL COURSE:  [name] a86 y.o.malewith a known history of Alzheimer's dementia, hyperlipidemia, hypertension comes to the emergency room after patient had a mechanical fall while getting out of the car fell backwards into the car and started having left hip pain.  Acute left proximal intertrochanteric femur fracture status post mechanical fall -admit to orthopedic floor -DR. KRASINSKI's input aprreciated -POD # 2 -PRN IV and PO pain meds -PT/OT noted. Patient will require home health PT and 24 hour assistance -DVT prophylaxis Lovenox  Postop anemia -hemoglobin 11.4--- 7.3--- one unit blood transfusion--8.7--8.0  Abnormal EKG--atrial flutter -patient asymptomatic -no cardiac history---this was confirmed with patient's daughter Travis Mccoy -Dr Humphrey Rolls consulted appreciate input--f/u as out pt -Echo EF 50-55% mild RA/LA mild LV global hypokinesis  Hypertension bp stable -not on any home bp meds  Dementia--chronic resume Namenda, depakote and lexapro patient has 24x7caregivers at home.  Hyperlipidemia Resume statins  BPH on Flomax at home--rx called in per son's request  Travis Mccoy continue PPI  overall  improving. Will discharge patient to home. Son agreeable. Discussed with Dr. Sabra Heck covering for Dr. Raliegh Ip Family Communication:spoke with son Travis Mccoy at bedside Consults:orthopedic drKrasinski Code Status:DNR prior to admission DVT prophylaxis: Lovenox discharge disposition: home with home health today  CONSULTS OBTAINED:  Treatment Team:  Thornton Park, MD Dionisio David, MD  DRUG ALLERGIES:   Allergies  Allergen Reactions  . Lisinopril Cough    DISCHARGE MEDICATIONS:   Allergies as of 03/24/2020      Reactions   Lisinopril Cough      Medication List    TAKE these medications   aspirin 81 MG tablet Take 1 tablet (81 mg total) by mouth daily.   divalproex 250 MG 24 hr tablet Commonly known as: Depakote ER Take 1 tablet every afternoon. May give additional dose as needed for agitation   enoxaparin 40 MG/0.4ML injection Commonly known as: LOVENOX Inject 0.4 mLs (40 mg total) into the skin daily for 14 days. Start taking on: March 25, 2020   escitalopram 20 MG tablet Commonly known as: Lexapro Take 1 tablet (20 mg total) by mouth daily.   feeding supplement (ENSURE ENLIVE) Liqd Take 237 mLs by mouth 2 (two) times daily between meals.   Melatonin 10 MG Tabs Take 10 mg by mouth at bedtime.   memantine 5 MG tablet Commonly known as: NAMENDA Take 1 tablet twice a day   multivitamin with minerals Tabs tablet Take 1 tablet by mouth daily.   omeprazole 20 MG capsule Commonly known as: PRILOSEC TAKE 1 CAPSULE BY MOUTH  DAILY   pravastatin 20 MG tablet Commonly known as: PRAVACHOL TAKE 1 TABLET BY MOUTH  EVERY OTHER EVENING   PRESERVISION AREDS 2+MULTI VIT PO Take 2 tablets by mouth daily.   tamsulosin 0.4  MG Caps capsule Commonly known as: FLOMAX Take 1 capsule (0.4 mg total) by mouth daily.   traMADol 50 MG tablet Commonly known as: ULTRAM Take 1 tablet (50 mg total) by mouth every 12 (twelve) hours as needed.            Durable  Medical Equipment  (From admission, onward)         Start     Ordered   03/22/20 1342  For home use only DME Hospital bed  Once    Question Answer Comment  Length of Need Lifetime   Patient has (list medical condition): alzheimers, hjip fracture   The above medical condition requires: Patient requires the ability to reposition frequently   Head must be elevated greater than: 30 degrees   Bed type Semi-electric   Support Surface: Gel Overlay      03/22/20 1342   03/22/20 1128  For home use only DME Walker rolling  Once    Question Answer Comment  Walker: With 5 Inch Wheels   Patient needs a walker to treat with the following condition Hip fracture ([place])      03/22/20 1127          If you experience worsening of your admission symptoms, develop shortness of breath, life threatening emergency, suicidal or homicidal thoughts you must seek medical attention immediately by calling 911 or calling your MD immediately  if symptoms less severe.  You Must read complete instructions/literature along with all the possible adverse reactions/side effects for all the Medicines you take and that have been prescribed to you. Take any new Medicines after you have completely understood and accept all the possible adverse reactions/side effects.   Please note  You were cared for by a hospitalist during your hospital stay. If you have any questions about your discharge medications or the care you received while you were in the hospital after you are discharged, you can call the unit and asked to speak with the hospitalist on call if the hospitalist that took care of you is not available. Once you are discharged, your primary care physician will handle any further medical issues. Please note that NO REFILLS for any discharge medications will be authorized once you are discharged, as it is imperative that you return to your primary care physician (or establish a relationship with a primary care physician if  you do not have one) for your aftercare needs so that they can reassess your need for medications and monitor your lab values. Today   SUBJECTIVE  Confusion at baseline   VITAL SIGNS:  Blood pressure (!) 111/57, pulse 81, temperature 97.7 F (36.5 C), resp. rate 15, height 5\' 7"  (1.702 m), weight 67 kg, SpO2 90 %.  I/O:    Intake/Output Summary (Last 24 hours) at 03/24/2020 1143 Last data filed at 03/23/2020 1900 Gross per 24 hour  Intake 320 ml  Output 300 ml  Net 20 ml    PHYSICAL EXAMINATION:  GENERAL:  86 y.o.-year-old patient lying in the bed with no acute distress.  Limited due to dementia EYES: Pupils equal, round, reactive to light and accommodation. No scleral icterus.  HEENT: Head atraumatic, normocephalic. Oropharynx and nasopharynx clear.  NECK:  Supple, no jugular venous distention. No thyroid enlargement, no tenderness.  LUNGS: Normal breath sounds bilaterally, no wheezing, rales,rhonchi or crepitation. No use of accessory muscles of respiration.  CARDIOVASCULAR: S1, S2 normal. No murmurs, rubs, or gallops.  ABDOMEN: Soft, non-tender, non-distended. Bowel sounds present. No organomegaly or  mass.  EXTREMITIES: No pedal edema, cyanosis, or clubbing.   NEUROLOGIC: grossly nonfocal PSYCHIATRIC: awake and alert-- advanced dementia at baseline SKIN: No obvious rash, lesion, or ulcer.   DATA REVIEW:   CBC  Recent Labs  Lab 03/24/20 0411  WBC 8.0  HGB 8.0*  HCT 24.5*  PLT 127*    Chemistries  Recent Labs  Lab 03/21/20 1435 03/23/20 0541 03/24/20 0411  NA 139   < > 137  K 4.5   < > 4.2  CL 104   < > 105  CO2 25   < > 24  GLUCOSE 107*   < > 123*  BUN 30*   < > 28*  CREATININE 0.89   < > 0.67  CALCIUM 9.1   < > 8.2*  AST 31  --   --   ALT 19  --   --   ALKPHOS 68  --   --   BILITOT 0.8  --   --    < > = values in this interval not displayed.    Microbiology Results   Recent Results (from the past 240 hour(s))  Respiratory Panel by RT PCR (Flu  A&B, Covid) - Nasopharyngeal Swab     Status: None   Collection Time: 03/21/20  3:55 PM   Specimen: Nasopharyngeal Swab  Result Value Ref Range Status   SARS Coronavirus 2 by RT PCR NEGATIVE NEGATIVE Final    Comment: (NOTE) SARS-CoV-2 target nucleic acids are NOT DETECTED. The SARS-CoV-2 RNA is generally detectable in upper respiratoy specimens during the acute phase of infection. The lowest concentration of SARS-CoV-2 viral copies this assay can detect is 131 copies/mL. A negative result does not preclude SARS-Cov-2 infection and should not be used as the sole basis for treatment or other patient management decisions. A negative result may occur with  improper specimen collection/handling, submission of specimen other than nasopharyngeal swab, presence of viral mutation(s) within the areas targeted by this assay, and inadequate number of viral copies (<131 copies/mL). A negative result must be combined with clinical observations, patient history, and epidemiological information. The expected result is Negative. Fact Sheet for Patients:  PinkCheek.be Fact Sheet for Healthcare Providers:  GravelBags.it This test is not yet ap proved or cleared by the [place] FDA and  has been authorized for detection and/or diagnosis of SARS-CoV-2 by FDA under an Emergency Use Authorization (EUA). This EUA will remain  in effect (meaning this test can be used) for the duration of the COVID-19 declaration under Section 564(b)(1) of the Act, 21 U.S.C. section 360bbb-3(b)(1), unless the authorization is terminated or revoked sooner.    Influenza A by PCR NEGATIVE NEGATIVE Final   Influenza B by PCR NEGATIVE NEGATIVE Final    Comment: (NOTE) The Xpert Xpress SARS-CoV-2/FLU/RSV assay is intended as an aid in  the diagnosis of influenza from Nasopharyngeal swab specimens and  should not be used as a sole basis for treatment. Nasal washings  and  aspirates are unacceptable for Xpert Xpress SARS-CoV-2/FLU/RSV  testing. Fact Sheet for Patients: PinkCheek.be Fact Sheet for Healthcare Providers: GravelBags.it This test is not yet approved or cleared by the [place] FDA and  has been authorized for detection and/or diagnosis of SARS-CoV-2 by  FDA under an Emergency Use Authorization (EUA). This EUA will remain  in effect (meaning this test can be used) for the duration of the  Covid-19 declaration under Section 564(b)(1) of the Act, 21  U.S.C. section 360bbb-3(b)(1), unless the authorization is  terminated or revoked. Performed at [facility], [address] [place], [place] [address]     RADIOLOGY:  ECHOCARDIOGRAM COMPLETE  Result Date: 03/23/2020    ECHOCARDIOGRAM REPORT   Patient Name:   QUANTE TOMASO Date of Exam: 03/22/2020 Medical Rec #:  [id number]     Height:       67.0 in Accession #:    [id number]    Weight:       147.7 lb Date of Birth:  08-Apr-1922     BSA:          1.778 m Patient Age:    86 years      BP:           93/59 mmHg Patient Gender: M             HR:           67 bpm. Exam Location:  [facility] Procedure: 2D Echo, Cardiac Doppler and Color Doppler Indications:     Atrial Flutter 427.32  History:         Patient has no prior history of Echocardiogram examinations.                  Risk Factors:Hypertension.  Sonographer:     Sherrie Sport RDCS (AE) Referring Phys:  [place] Diagnosing Phys: Neoma Laming MD IMPRESSIONS  1. Left ventricular ejection fraction, by estimation, is 50 to 55%. The left ventricle has low normal function. The left ventricle demonstrates global hypokinesis. The left ventricular internal cavity size was moderately dilated. Left ventricular diastolic parameters are indeterminate.  2. Right ventricular systolic function is normal. The right ventricular size is normal. There is moderately elevated pulmonary artery systolic  pressure.  3. Left atrial size was moderately dilated.  4. Right atrial size was moderately dilated.  5. The mitral valve is normal in structure. Mild mitral valve regurgitation. No evidence of mitral stenosis.  6. The tricuspid valve is myxomatous. Tricuspid valve regurgitation is mild to moderate.  7. The aortic valve is normal in structure. Aortic valve regurgitation is trivial. Mild aortic valve sclerosis is present, with no evidence of aortic valve stenosis.  8. The inferior vena cava is normal in size with greater than 50% respiratory variability, suggesting right atrial pressure of 3 mmHg. FINDINGS  Left Ventricle: Left ventricular ejection fraction, by estimation, is 50 to 55%. The left ventricle has low normal function. The left ventricle demonstrates global hypokinesis. The left ventricular internal cavity size was moderately dilated. There is no left ventricular hypertrophy. Left ventricular diastolic parameters are indeterminate. Right Ventricle: The right ventricular size is normal. No increase in right ventricular wall thickness. Right ventricular systolic function is normal. There is moderately elevated pulmonary artery systolic pressure. The tricuspid regurgitant velocity is 3.08 m/s, and with an assumed right atrial pressure of 10 mmHg, the estimated right ventricular systolic pressure is [id number] mmHg. Left Atrium: Left atrial size was moderately dilated. Right Atrium: Right atrial size was moderately dilated. Pericardium: There is no evidence of pericardial effusion. Mitral Valve: The mitral valve is normal in structure. Normal mobility of the mitral valve leaflets. Mild mitral valve regurgitation. No evidence of mitral valve stenosis. Tricuspid Valve: The tricuspid valve is myxomatous. Tricuspid valve regurgitation is mild to moderate. No evidence of tricuspid stenosis. Aortic Valve: The aortic valve is normal in structure. Aortic valve regurgitation is trivial. Mild aortic valve sclerosis is  present, with no evidence of aortic valve stenosis. Aortic valve mean gradient measures  2.3 mmHg. Aortic valve peak gradient measures 4.0 mmHg. Aortic valve area, by VTI measures 2.05 cm. Pulmonic Valve: The pulmonic valve was normal in structure. Pulmonic valve regurgitation is not visualized. No evidence of pulmonic stenosis. Aorta: The aortic root is normal in size and structure. Venous: The inferior vena cava is normal in size with greater than 50% respiratory variability, suggesting right atrial pressure of 3 mmHg. IAS/Shunts: No atrial level shunt detected by color flow Doppler.  LEFT VENTRICLE PLAX 2D LVIDd:         4.29 cm  Diastology LVIDs:         2.21 cm  LV e' lateral:   8.38 cm/s LV PW:         0.90 cm  LV E/e' lateral: 10.4 LV IVS:        0.94 cm  LV e' medial:    5.55 cm/s LVOT diam:     2.00 cm  LV E/e' medial:  15.6 LV SV:         48 LV SV Index:   27 LVOT Area:     3.14 cm  LEFT ATRIUM            Index       RIGHT ATRIUM           Index LA diam:      4.80 cm  2.70 cm/m  RA Area:     15.30 cm LA Vol (A2C): 99.0 ml  55.69 ml/m RA Volume:   30.00 ml  16.87 ml/m LA Vol (A4C): 135.0 ml 75.93 ml/m  AORTIC VALVE                   PULMONIC VALVE AV Area (Vmax):    1.81 cm    PV Vmax:        0.63 m/s AV Area (Vmean):   1.92 cm    PV Peak grad:   1.6 mmHg AV Area (VTI):     2.05 cm    RVOT Peak grad: 2 mmHg AV Vmax:           100.17 cm/s AV Vmean:          68.400 cm/s AV VTI:            0.234 m AV Peak Grad:      4.0 mmHg AV Mean Grad:      2.3 mmHg LVOT Vmax:         57.60 cm/s LVOT Vmean:        41.900 cm/s LVOT VTI:          0.153 m LVOT/AV VTI ratio: 0.65  AORTA Ao Root diam: 3.20 cm MITRAL VALVE               TRICUSPID VALVE MV Area (PHT): 5.13 cm    TR Peak grad:   37.9 mmHg MV Decel Time: 148 msec    TR Vmax:        308.00 cm/s MV E velocity: 86.80 cm/s MV A velocity: 36.90 cm/s  SHUNTS MV E/A ratio:  2.35        Systemic VTI:  0.15 m                            Systemic Diam: 2.00 cm  Neoma Laming MD Electronically signed by Neoma Laming MD Signature Date/Time: 03/23/2020/9:07:23 AM    Final    DG HIP OPERATIVE UNILAT W OR W/O PELVIS  LEFT  Result Date: 03/22/2020 CLINICAL DATA:  Intramedullary nail left hip. EXAM: OPERATIVE LEFT HIP (WITH PELVIS IF PERFORMED) 21 VIEWS TECHNIQUE: Fluoroscopic spot image(s) were submitted for interpretation post-operatively. COMPARISON:  03/21/2020 FINDINGS: Examination demonstrates fixation patient's left intertrochanteric fracture with intramedullary nail and associated bridging screw into the femoral head. Hardware is intact as there is near anatomic alignment over the fracture site. IMPRESSION: Post fixation of left femoral intertrochanteric fracture with hardware intact and near anatomic alignment over the fracture site. Electronically Signed   By: Marin Olp M.D.   On: 03/22/2020 14:34   DG FEMUR PORT MIN 2 VIEWS LEFT  Result Date: 03/22/2020 CLINICAL DATA:  Status post intramedullary rod fixation of the left femur. EXAM: LEFT FEMUR PORTABLE 2 VIEWS COMPARISON:  Fluoroscopic images of same day. FINDINGS: Status post intramedullary rod fixation of proximal left femoral intertrochanteric fracture. Good alignment of fracture components is noted. Expected postoperative changes are noted in the surrounding soft tissues. Vascular calcifications are noted. IMPRESSION: Status post intramedullary rod fixation of proximal left femoral intertrochanteric fracture. Electronically Signed   By: Marijo Conception M.D.   On: 03/22/2020 16:08     CODE STATUS:     Code Status Orders  (From admission, onward)         Start     Ordered   03/21/20 1900  Full code  Continuous     03/21/20 1859        Code Status History    This patient has a current code status but no historical code status.   Advance Care Planning Activity       TOTAL TIME TAKING CARE OF THIS PATIENT: *40* minutes.    Fritzi Mandes M.D  [facility]    CC: Primary care  physician; No primary care provider on file.

## 2020-03-24 NOTE — Plan of Care (Signed)

## 2020-03-24 NOTE — Progress Notes (Signed)
Physical Therapy Treatment Patient Details Name: Travis Mccoy MRN: [id number] DOB: [birth year] Today's Date: 03/24/2020    History of Present Illness Pt admitted for complaints of fall getting out of car and now with L hip fx. Pt is POD 1 s/p IM nailing. HIstory includes Alzheimers and HTN.    PT Comments    Pt was asleep in supine with son/caregiver in room. Pt easily awakes and agrees to PT session. Pt had baseline dementia that slows progression. He was able to progress from supine to short sit EOB with max assist of one. Pt unable to rate pain but facial expression express pain only with movements and wt bearing. He was able to tolerated standing from elevated bed height 3 x ~ 1 minute each trial. Focused on improving wt acceptance on LLE. Extended rest between trials. Lengthy discussion with pt's son and caregiver about expectations and needs. Pt will be transported home via EMS with HHPT to progress deficits with strength and safe functional mobility. Pt's son and caregiver state confidence in ability to care for pt at home. Acute PT will continue current POC until pt is DC to home.     Follow Up Recommendations  Home health PT;Supervision/Assistance - 24 hour     Equipment Recommendations  Rolling walker with 5" wheels;3in1 (PT);Wheelchair (measurements PT);Hospital bed    Recommendations for Other Services       Precautions / Restrictions Precautions Precautions: Fall Restrictions Weight Bearing Restrictions: Yes LLE Weight Bearing: Weight bearing as tolerated    Mobility  Bed Mobility Overal bed mobility: Needs Assistance Bed Mobility: Supine to Sit;Sit to Supine     Supine to sit: Max assist Sit to supine: Max assist      Transfers Overall transfer level: Needs assistance Equipment used: Rolling walker (2 wheeled) Transfers: Sit to/from Stand Sit to Stand: From elevated surface;Mod assist         General transfer comment: Mod assist + max vcs for stand EOB  3 x. Vcs for handplacement and overall technique. pt guarding LLE 2/2 to pain  Ambulation/Gait             General Gait Details: unable to progress safely at this time   Stairs             Wheelchair Mobility    Modified Rankin (Stroke Patients Only)       Balance Overall balance assessment: History of Falls;Needs assistance Sitting-balance support: Feet supported;Bilateral upper extremity supported Sitting balance-Leahy Scale: Fair Sitting balance - Comments: pt sat EOB x 20 minutes throughout session                                    Cognition Arousal/Alertness: Lethargic Behavior During Therapy: WFL for tasks assessed/performed Overall Cognitive Status: History of cognitive impairments - at baseline                                 General Comments: Pt has baseline dementia but was cooperative and followed commands pretty consistantly      Exercises      General Comments        Pertinent Vitals/Pain Pain Assessment: Faces Faces Pain Scale: Hurts even more Pain Location: L hip, appears to improve with rest Pain Descriptors / Indicators: Operative site guarding Pain Intervention(s): Limited activity within patient's tolerance;Monitored during session;Repositioned  Home Living                      Prior Function            PT Goals (current goals can now be found in the care plan section) Acute Rehab PT Goals Patient Stated Goal: unable to state Progress towards PT goals: Progressing toward goals    Frequency    BID      PT Plan Current plan remains appropriate    Co-evaluation              AM-PAC PT "6 Clicks" Mobility   Outcome Measure  Help needed turning from your back to your side while in a flat bed without using bedrails?: A Lot Help needed moving from lying on your back to sitting on the side of a flat bed without using bedrails?: A Lot Help needed moving to and from a bed to a  chair (including a wheelchair)?: Total Help needed standing up from a chair using your arms (e.g., wheelchair or bedside chair)?: Total Help needed to walk in hospital room?: Total Help needed climbing 3-5 steps with a railing? : Total 6 Click Score: 8    End of Session Equipment Utilized During Treatment: Gait belt Activity Tolerance: Patient limited by fatigue;Patient limited by pain Patient left: in bed;with bed alarm set Nurse Communication: Mobility status PT Visit Diagnosis: Muscle weakness (generalized) (M62.81);Difficulty in walking, not elsewhere classified (R26.2);Pain;Unsteadiness on feet (R26.81) Pain - Right/Left: Left Pain - part of body: Hip     Time: [id number] PT Time Calculation (min) (ACUTE ONLY): 45 min  Charges:  $Therapeutic Activity: 38-52 mins                    Julaine Fusi PTA 03/24/20, 10:57 AM

## 2020-03-24 NOTE — Progress Notes (Signed)
OT Cancellation Note  Patient Details Name: Travis Mccoy MRN: [id number] DOB: 1922-06-08   Cancelled Treatment:    Reason Eval/Treat Not Completed: Other (comment)  Order received and chart reviewed.  Patient participating in physical therapy session upon entry.  Will follow up and re-attempt as able and appropriate.  Thank you.  Oren Binet 03/24/2020, 10:36 AM

## 2020-03-24 NOTE — Progress Notes (Signed)
Pt discharged home via EMS

## 2020-03-24 NOTE — Progress Notes (Signed)
Subjective: 2 Days Post-Op Procedure(s) (LRB): INTRAMEDULLARY (IM) NAIL INTERTROCHANTRIC (Left)   Patient is alert and stable.  He is participating with PT.  He is walking short distances.  He has 24-hour nursing care at home.  He is being discharged to home today.  He will have home home health PT.  He will follow up with Dr. Harlow Mares in 2 weeks. Dressings are dry.  Progress stable.  Patient reports pain as mild.  Objective:   VITALS:   Vitals:   03/23/20 2324 03/24/20 0824  BP: 109/66 (!) 111/57  Pulse: 72 81  Resp: 16 15  Temp: (!) 97.5 F (36.4 C) 97.7 F (36.5 C)  SpO2: 99% 90%    Neurologically intact Neurovascular intact Sensation intact distally Intact pulses distally Dorsiflexion/Plantar flexion intact Incision: dressing C/D/I  LABS Recent Labs    03/21/20 1435 03/21/20 1435 03/23/20 0541 03/23/20 1754 03/24/20 0411  HGB 11.4*   < > 7.3* 8.7* 8.0*  HCT 34.2*   < > 22.1* 26.2* 24.5*  WBC 6.3  --  6.8  --  8.0  PLT 168  --  127*  --  127*   < > = values in this interval not displayed.    Recent Labs    03/21/20 1435 03/23/20 0541 03/24/20 0411  NA 139 139 137  K 4.5 4.4 4.2  BUN 30* 28* 28*  CREATININE 0.89 0.73 0.67  GLUCOSE 107* 123* 123*    Recent Labs    03/21/20 2027  INR 1.1     Assessment/Plan: 2 Days Post-Op Procedure(s) (LRB): INTRAMEDULLARY (IM) NAIL INTERTROCHANTRIC (Left)   Advance diet Up with therapy Discharge home with home health   Follow-up appointment with Dr. Harlow Mares in 2 weeks.

## 2020-03-24 NOTE — Discharge Instructions (Signed)
Hip Fracture  A hip fracture is a break in the upper part of the thigh bone (femur). This is usually the result of an injury, commonly a fall. What are the causes? This condition may be caused by:  A direct hit or injury (trauma) to the side of the hip, such as from a fall or a car accident. What increases the risk? You are more likely to develop this condition if:  You have poor balance or an unsteady walking pattern (gait). Certain conditions contribute to poor balance, including Parkinson disease and dementia.  You have thinning or weakening of your bones, such as from osteopenia or osteoporosis.  You have cancer that spreads to the leg bones.  You have certain conditions that can weaken your bones, such as thyroid disorders, intestine disorders, or a lack (deficiency) of certain nutrients.  You smoke.  You take certain medicines, such as steroids.  You have a history of broken bones. What are the signs or symptoms? Symptoms of this condition include:  Pain over the injured hip. This is commonly felt on the side of the hip or in the front groin area.  Stiffness, bruising, and swelling over the hip.  Pain with movement of the leg, especially lifting it up. Pain often gets better with rest.  Difficulty or inability to stand, walk, or use the leg to support body weight (put weight on the leg).  The leg rolling outward when lying down.  The affected leg being shorter than the other leg. How is this diagnosed? This condition may be diagnosed based on:  Your symptoms.  A physical exam.  X-rays. These may be done: ? To confirm the diagnosis. ? To determine the type and location of the fracture. ? To check for other injuries.  MRI or CT scans. These may be done if the fracture is not visible on an X-ray. How is this treated? Treatment for this condition depends on the severity and location of your fracture. In most cases, surgery is necessary. Surgery may  involve:  Repairing the fracture with a screw, nail, or rod to hold the bone in place (open reduction and internal fixation, ORIF).  Replacing the damaged parts of the femur with metal implants (hemiarthroplasty or arthroplasty). If your fracture is less severe, or if you are not eligible for surgery, you may have non-surgical treatment. Non-surgical treatment may involve:  Using crutches, a walker, or a wheelchair until your health care provider says that you can support (bear) weight on your hip.  Medicines to help reduce pain and swelling.  Having regular X-rays to monitor your fracture and make sure that it is healing.  Physical therapy. You may need physical therapy after surgery, too. Follow these instructions at home: Activity  Do not use your injured leg to support your body weight until your health care provider says that you can. ? Follow standing and walking restrictions as told by your health care provider. ? Use crutches, a walker, or a wheelchair as directed.  Avoid any activities that cause pain or irritation in your hip. Ask your health care provider what activities are safe for you.  Do not drive or use heavy machinery until your health care provider approves.  If physical therapy was prescribed, do exercises as told by your health care provider. General instructions  Take over-the-counter and prescription medicines only as told by your health care provider.  If directed, put ice on the injured area: ? Put ice in a plastic bag. ?   Place a towel between your skin and the bag. ? Leave the ice on for 20 minutes, 2-3 times a day.  Do not use any products that contain nicotine or tobacco, such as cigarettes and e-cigarettes. These can delay bone healing. If you need help quitting, ask your health care provider.  Keep all follow-up visits as told by your health care provider. This is important. How is this prevented?  To prevent falls at home: ? Use a cane, walker,  or wheelchair as directed. ? Make sure your rooms and hallways are free of clutter, obstacles, and cords. ? Install grab bars in your bedroom and bathrooms. ? Always use handrails when going up and down stairs. ? Use nightlights around the house.  Exercise regularly. Ask what forms of exercise are safe for you, such as walking and strength and balance exercises.  Visit an eye doctor regularly to have your eyesight checked. This can help prevent falls.  Make sure you get enough calcium and vitamin D.  Do not use any products that contain nicotine or tobacco, such as cigarettes and e-cigarettes. If you need help quitting, ask your health care provider.  Limit alcohol use.  If you have an underlying condition that caused your hip fracture, work with your health care provider to manage your condition. Contact a health care provider if:  Your pain gets worse or it does not get better with rest or medicine.  You develop any of the following in your leg or foot: ? Numbness. ? Tingling. ? A change in skin color (discoloration). ? Skin feeling cold to the touch. Get help right away if:  Your pain suddenly gets worse.  You cannot move your hip. Summary  A hip fracture is a break in the upper part of the thigh bone (femur).  Treatment typically require surgical management to restore stability and function to the hip.  Pain medicine and icing of the affected leg can help manage pain and swelling. Follow directions as told by your health care provider. This information is not intended to replace advice given to you by your health care provider. Make sure you discuss any questions you have with your health care provider. Document Revised: 09/04/2018 Document Reviewed: 01/17/2017 Elsevier Patient Education  2020 [employer]vier [employer]  

## 2020-03-24 NOTE — TOC Transition Note (Addendum)
Transition of Care [facility]) - CM/SW Discharge Note   Patient Details  Name: KAISAN BOLAM MRN: [id number] Date of Birth: 20-Jan-1922  Transition of Care [facility]) CM/SW Contact:  Boris Sharper, LCSW Phone [phone] 03/24/2020, 1:32 PM   Clinical Narrative:    Pt medically stable for discharge. Pt will be transported Home by EMS. CSW notified pt's daughter Marlowe Kays and Helene Kelp with [facility] of discharge. Nurse stated that she would cal EMS for transport. TOC signing off.    Final next level of care: [place] Barriers to Discharge: No Barriers Identified   Patient Goals and CMS Choice        Discharge Placement                Patient to be transferred to facility by: EMS Name of family member notified: Marlowe Kays Patient and family notified of of transfer: 03/24/20  Discharge Plan and [place]: [facility] [employer] Date [place]: 03/24/20 Time [place]: [place] Representative spoke with at [place]: [place] ([place]) Interventions     Readmission Risk Interventions No flowsheet data found.

## 2020-03-24 NOTE — Evaluation (Signed)
Occupational Therapy Evaluation Patient Details Name: ANGELO BURRIS MRN: [id number] DOB: 02-14-1922 Today's Date: 03/24/2020    History of Present Illness Pt admitted for complaints of fall getting out of car and now with L hip fx. Pt is POD 1 s/p IM nailing. HIstory includes Alzheimers and HTN.   Clinical Impression   Patient seen this date for OT evaluation.  Pt supine in bed with son at beside.  Son reports pt is doing worse than he was this morning.  Provided extensive education on goals and role of OT in acute care setting and home health.  See "other exercises" for more information.  Patient would benefit from continued occupational therapy treatment to address ADL remediation and family/caregiver education to reduce caregiver burden.  Based on today's performance, recommending [place] OT.      Follow Up Recommendations  Home health OT    Equipment Recommendations  Other (comment);Tub/shower seat(needed equipment has been ordered (2WW, 3-in-1 BSC, hospital bed))    Recommendations for Other Services       Precautions / Restrictions Precautions Precautions: Fall Restrictions Weight Bearing Restrictions: Yes LLE Weight Bearing: Weight bearing as tolerated      Mobility Bed Mobility Overal bed mobility: Needs Assistance             General bed mobility comments: Required MAX A to positiong patient in midline.  Demonstrates tendancy to curl to R side  Transfers Overall transfer level: Needs assistance               General transfer comment: Can very depending on level of alertness/lethargy.  MOD-MAX A x 2 (per PT notes)    Balance Overall balance assessment: History of Falls;Needs assistance                                         ADL either performed or assessed with clinical judgement   ADL Overall ADL's : Needs assistance/impaired Eating/Feeding: Maximal assistance;Bed level Eating/Feeding Details (indicate cue type and reason): Depends on  level of lethargy and alertness Grooming: Wash/dry hands;Wash/dry face;Maximal assistance;Bed level Grooming Details (indicate cue type and reason): hand over hand assistance                               General ADL Comments: Patient currently requiring MAX A x 1-2 for ADLs and functional transfers/mobility.  Level of assistance can change depending on patient's level of alertness (dementia)     Vision Baseline Vision/History: Legally blind;Macular Degeneration(color blind) Patient Visual Report: No change from baseline       Perception     Praxis      Pertinent Vitals/Pain Pain Assessment: (Unable to rate pain. but very fearful of movement.)     Hand Dominance Right   Extremity/Trunk Assessment Upper Extremity Assessment Upper Extremity Assessment: Generalized weakness   Lower Extremity Assessment Lower Extremity Assessment: Defer to PT evaluation       Communication Communication Communication: Other (comment)(dementia - sometimes garbled speech)   Cognition Arousal/Alertness: Lethargic Behavior During Therapy: WFL for tasks assessed/performed Overall Cognitive Status: History of cognitive impairments - at baseline                                 General Comments: Baseline dementia, but able to follow one  step directions fairly consistently   General Comments       Exercises Other Exercises Other Exercises: Provided extensive education to son on goals and role of OT in acute care setting and with home health Other Exercises: Provided education on command following techniques for people with dementia Other Exercises: educated on compensatory techniques to safely perform dressing tasks   Shoulder Instructions      Home Living Family/patient expects to be discharged to:: Private residence Living Arrangements: Alone Available Help at Discharge: Available 24 hours/day;Personal care attendant Type of Home: House Home Access: Stairs to  enter [employer] of Steps: 4   Home Layout: One level               Home Equipment: Cane - single point          Prior Functioning/Environment Level of Independence: Needs assistance  Gait / Transfers Assistance Needed: SPC at baseline ADL's / Homemaking Assistance Needed: Son reports pt needed occasional assistance for toileting, dressing and bathing recently.            OT Problem List: Decreased strength;Decreased activity tolerance;Impaired balance (sitting and/or standing);Decreased safety awareness;Decreased cognition;Decreased knowledge of precautions;Decreased knowledge of use of DME or AE      OT Treatment/Interventions: Self-care/ADL training;Therapeutic exercise;Energy conservation;DME and/or AE instruction;Therapeutic activities;Cognitive remediation/compensation;Patient/family education    OT Goals(Current goals can be found in the care plan section) Acute Rehab OT Goals Patient Stated Goal: unable to state  OT Frequency: Min 1X/week   Barriers to D/C:            Co-evaluation              AM-PAC OT "6 Clicks" Daily Activity     Outcome Measure Help from another person eating meals?: A Lot Help from another person taking care of personal grooming?: A Lot Help from another person toileting, which includes using toliet, bedpan, or urinal?: A Lot Help from another person bathing (including washing, rinsing, drying)?: A Lot Help from another person to put on and taking off regular upper body clothing?: A Lot Help from another person to put on and taking off regular lower body clothing?: A Lot 6 Click Score: 12   End of Session Nurse Communication: Other (comment)(discussed lab values)  Activity Tolerance: Patient limited by lethargy Patient left: in bed;with call bell/phone within reach;with bed alarm set;with family/visitor present  OT Visit Diagnosis: Unsteadiness on feet (R26.81);Muscle weakness (generalized) (M62.81);History of  falling (Z91.81)                Time: [id number] OT Time Calculation (min): 44 min Charges:  OT General Charges $OT Visit: 1 Visit OT Evaluation $OT Eval Moderate Complexity: 1 Mod OT Treatments $Self Care/Home Management : 38-52 mins  Baldomero Lamy, MS, OTR/L 03/24/20, 2:02 PM

## 2020-03-24 NOTE — Progress Notes (Signed)
SUBJECTIVE: Patient resting comfortably in bed. Denies any chest pain or shortness of breath. Patient currently in atrial flutter with a rate in the 70s.   Vitals:   03/23/20 1400 03/23/20 1627 03/23/20 2324 03/24/20 0824  BP: (!) 113/44 (!) 103/48 109/66 (!) 111/57  Pulse: 82 72 72 81  Resp: 18 17 16 15   Temp: 98 F (36.7 C) 98 F (36.7 C) (!) 97.5 F (36.4 C) 97.7 F (36.5 C)  TempSrc: Oral Oral Oral   SpO2: 99% 100% 99% 90%  Weight:      Height:        Intake/Output Summary (Last 24 hours) at 03/24/2020 0940 Last data filed at 03/23/2020 1900 Gross per 24 hour  Intake 320 ml  Output 300 ml  Net 20 ml    LABS: Basic Metabolic Panel: Recent Labs    03/23/20 0541 03/24/20 0411  NA 139 137  K 4.4 4.2  CL 107 105  CO2 26 24  GLUCOSE 123* 123*  BUN 28* 28*  CREATININE 0.73 0.67  CALCIUM 8.2* 8.2*   Liver Function Tests: Recent Labs    03/21/20 1435  AST 31  ALT 19  ALKPHOS 68  BILITOT 0.8  PROT 5.9*  ALBUMIN 3.5   No results for input(s): LIPASE, AMYLASE in the last 72 hours. CBC: Recent Labs    03/23/20 0541 03/23/20 0541 03/23/20 1754 03/24/20 0411  WBC 6.8  --   --  8.0  HGB 7.3*   < > 8.7* 8.0*  HCT 22.1*   < > 26.2* 24.5*  MCV 99.1  --   --  98.0  PLT 127*  --   --  127*   < > = values in this interval not displayed.   Cardiac Enzymes: No results for input(s): CKTOTAL, CKMB, CKMBINDEX, TROPONINI in the last 72 hours. BNP: Invalid input(s): POCBNP D-Dimer: No results for input(s): DDIMER in the last 72 hours. Hemoglobin A1C: No results for input(s): HGBA1C in the last 72 hours. Fasting Lipid Panel: No results for input(s): CHOL, HDL, LDLCALC, TRIG, CHOLHDL, LDLDIRECT in the last 72 hours. Thyroid Function Tests: No results for input(s): TSH, T4TOTAL, T3FREE, THYROIDAB in the last 72 hours.  Invalid input(s): FREET3 Anemia Panel: No results for input(s): VITAMINB12, FOLATE, FERRITIN, TIBC, IRON, RETICCTPCT in the last 72  hours.   PHYSICAL EXAM General: Well developed, well nourished, in no acute distress HEENT:  Normocephalic and atramatic Neck:  No JVD.  Lungs: Clear bilaterally to auscultation and percussion. Heart: Regularly irregular . Normal S1 and S2 without gallops or murmurs.  Abdomen: Bowel sounds are positive, abdomen soft and non-tender  Msk:  Recent surgery. UTA. Extremities: No clubbing, cyanosis or edema.   Neuro: Alert and oriented X 3. Psych:  Good affect, responds appropriately  TELEMETRY: Atrial Flutter 70s.  ASSESSMENT AND PLAN: Patient given surgical clearance 3/25 and had intramedullary fixation of his left intertrochanteric hip w/o complication. Patient has converted back into atrial flutter but is rate controlled. Hemodynamically stable. Echo revealed an EF 50-55% with mild to moderate TR which can be further managed as an outpatient. With patient's age and recent surgery will continue to hold anticoagulation. Patient currently stable and as long as he continues to be rate controlled if he converts back to atrial flutter we will plan on continuing to see the patient in the outpatient setting. From a cardiac point of view the patient is stable for discharge. Please ensure a f/u with [facility] within 2 weeks after  discharge.      Active Problems:   Intertrochanteric fracture of left femur ([place])     Travis Sill, NP-C 03/24/2020 9:40 AM

## 2020-03-24 NOTE — Progress Notes (Signed)
Patient is being discharged home today with [facility] & PT. DC & Rx instructions given to son and this nurse answered his questions to his satisfaction. IV removed NT to prepare patient for transport via EMS and will pack his belongings. EMS called.

## 2020-03-25 DIAGNOSIS — K219 Gastro-esophageal reflux disease without esophagitis: Secondary | ICD-10-CM | POA: Diagnosis not present

## 2020-03-25 DIAGNOSIS — E785 Hyperlipidemia, unspecified: Secondary | ICD-10-CM | POA: Diagnosis not present

## 2020-03-25 DIAGNOSIS — Z87891 Personal history of nicotine dependence: Secondary | ICD-10-CM | POA: Diagnosis not present

## 2020-03-25 DIAGNOSIS — I083 Combined rheumatic disorders of mitral, aortic and tricuspid valves: Secondary | ICD-10-CM | POA: Diagnosis not present

## 2020-03-25 DIAGNOSIS — G309 Alzheimer's disease, unspecified: Secondary | ICD-10-CM | POA: Diagnosis not present

## 2020-03-25 DIAGNOSIS — S72142D Displaced intertrochanteric fracture of left femur, subsequent encounter for closed fracture with routine healing: Secondary | ICD-10-CM | POA: Diagnosis not present

## 2020-03-25 DIAGNOSIS — I4892 Unspecified atrial flutter: Secondary | ICD-10-CM | POA: Diagnosis not present

## 2020-03-25 DIAGNOSIS — Z7982 Long term (current) use of aspirin: Secondary | ICD-10-CM | POA: Diagnosis not present

## 2020-03-25 DIAGNOSIS — Z9181 History of falling: Secondary | ICD-10-CM | POA: Diagnosis not present

## 2020-03-25 DIAGNOSIS — I10 Essential (primary) hypertension: Secondary | ICD-10-CM | POA: Diagnosis not present

## 2020-03-25 DIAGNOSIS — Z7901 Long term (current) use of anticoagulants: Secondary | ICD-10-CM | POA: Diagnosis not present

## 2020-03-25 LAB — TYPE AND SCREEN
ABO/RH(D): O POS
Antibody Screen: NEGATIVE
Unit division: 0

## 2020-03-25 LAB — BPAM RBC
Blood Product Expiration Date: 202104252359
ISSUE DATE / TIME: 202103261322
Unit Type and Rh: 5100

## 2020-03-26 DIAGNOSIS — Z8781 Personal history of (healed) traumatic fracture: Secondary | ICD-10-CM | POA: Insufficient documentation

## 2020-03-27 ENCOUNTER — Telehealth: Payer: Self-pay | Admitting: Family Medicine

## 2020-03-27 DIAGNOSIS — E785 Hyperlipidemia, unspecified: Secondary | ICD-10-CM | POA: Diagnosis not present

## 2020-03-27 DIAGNOSIS — I10 Essential (primary) hypertension: Secondary | ICD-10-CM | POA: Diagnosis not present

## 2020-03-27 DIAGNOSIS — I083 Combined rheumatic disorders of mitral, aortic and tricuspid valves: Secondary | ICD-10-CM | POA: Diagnosis not present

## 2020-03-27 DIAGNOSIS — Z87891 Personal history of nicotine dependence: Secondary | ICD-10-CM | POA: Diagnosis not present

## 2020-03-27 DIAGNOSIS — Z7982 Long term (current) use of aspirin: Secondary | ICD-10-CM | POA: Diagnosis not present

## 2020-03-27 DIAGNOSIS — I4892 Unspecified atrial flutter: Secondary | ICD-10-CM | POA: Diagnosis not present

## 2020-03-27 DIAGNOSIS — G309 Alzheimer's disease, unspecified: Secondary | ICD-10-CM | POA: Diagnosis not present

## 2020-03-27 DIAGNOSIS — Z9181 History of falling: Secondary | ICD-10-CM | POA: Diagnosis not present

## 2020-03-27 DIAGNOSIS — Z7901 Long term (current) use of anticoagulants: Secondary | ICD-10-CM | POA: Diagnosis not present

## 2020-03-27 DIAGNOSIS — K219 Gastro-esophageal reflux disease without esophagitis: Secondary | ICD-10-CM | POA: Diagnosis not present

## 2020-03-27 DIAGNOSIS — S72142D Displaced intertrochanteric fracture of left femur, subsequent encounter for closed fracture with routine healing: Secondary | ICD-10-CM | POA: Diagnosis not present

## 2020-03-27 NOTE — Telephone Encounter (Signed)
See mychart message, after recent discharge, I placed referral to [facility] hosp[facility] - faxed form today 03/27/20 to do initial evaluation and admit if eligible. Family request hospice and we discussed.  Now today later in day 3/30 I received a fax request from [facility] after [facility] requested a palliative referral on his discharge ,that I was not aware of.  I spoke with hospice referral intake and we agreed that they will proceed with hospice evaluation and we can defer / hold off on palliative for now, and if indicated instead for palliative to be more appropriate will change referral  [name], [facility] 03/27/2020, 12:27 PM

## 2020-03-28 ENCOUNTER — Encounter: Payer: Self-pay | Admitting: Family Medicine

## 2020-03-28 DIAGNOSIS — Z515 Encounter for palliative care: Secondary | ICD-10-CM | POA: Insufficient documentation

## 2020-03-28 NOTE — Telephone Encounter (Signed)
Issue today with patient attempting to call us on phone around 4 to 430pm but could not get through.  She ended up sending [place] mychart message for an urgent medical issue.  I did review this before 5pm and tried to coordinate with patient's daughter Marlowe Kays who sent the message, but could not reach her. I left voice mail advised her to call EMS.  She did call EMS and they evaluated the patient.  Also I spoke with hospice on call and they have not admitted him yet. She wants to wait for 1 week for now, and wanted me to keep his standing orders until that time.  [name], [facility] 03/28/2020, 6:02 PM

## 2020-03-29 ENCOUNTER — Telehealth: Payer: Self-pay | Admitting: Adult Health Nurse Practitioner

## 2020-03-29 ENCOUNTER — Other Ambulatory Visit: Payer: Self-pay

## 2020-03-29 ENCOUNTER — Encounter: Payer: Self-pay | Admitting: Family Medicine

## 2020-03-29 ENCOUNTER — Other Ambulatory Visit: Payer: Medicare Other | Admitting: Adult Health Nurse Practitioner

## 2020-03-29 DIAGNOSIS — R062 Wheezing: Secondary | ICD-10-CM | POA: Insufficient documentation

## 2020-03-29 DIAGNOSIS — E785 Hyperlipidemia, unspecified: Secondary | ICD-10-CM | POA: Diagnosis not present

## 2020-03-29 DIAGNOSIS — Z515 Encounter for palliative care: Secondary | ICD-10-CM

## 2020-03-29 DIAGNOSIS — G309 Alzheimer's disease, unspecified: Secondary | ICD-10-CM | POA: Diagnosis not present

## 2020-03-29 DIAGNOSIS — F02818 Dementia in other diseases classified elsewhere, unspecified severity, with other behavioral disturbance: Secondary | ICD-10-CM

## 2020-03-29 DIAGNOSIS — Z9181 History of falling: Secondary | ICD-10-CM | POA: Diagnosis not present

## 2020-03-29 DIAGNOSIS — I083 Combined rheumatic disorders of mitral, aortic and tricuspid valves: Secondary | ICD-10-CM | POA: Diagnosis not present

## 2020-03-29 DIAGNOSIS — G301 Alzheimer's disease with late onset: Secondary | ICD-10-CM

## 2020-03-29 DIAGNOSIS — Z87891 Personal history of nicotine dependence: Secondary | ICD-10-CM | POA: Diagnosis not present

## 2020-03-29 DIAGNOSIS — K219 Gastro-esophageal reflux disease without esophagitis: Secondary | ICD-10-CM | POA: Diagnosis not present

## 2020-03-29 DIAGNOSIS — Z7982 Long term (current) use of aspirin: Secondary | ICD-10-CM | POA: Diagnosis not present

## 2020-03-29 DIAGNOSIS — Z7901 Long term (current) use of anticoagulants: Secondary | ICD-10-CM | POA: Diagnosis not present

## 2020-03-29 DIAGNOSIS — I4892 Unspecified atrial flutter: Secondary | ICD-10-CM | POA: Diagnosis not present

## 2020-03-29 DIAGNOSIS — S72142D Displaced intertrochanteric fracture of left femur, subsequent encounter for closed fracture with routine healing: Secondary | ICD-10-CM | POA: Diagnosis not present

## 2020-03-29 DIAGNOSIS — F0281 Dementia in other diseases classified elsewhere with behavioral disturbance: Secondary | ICD-10-CM

## 2020-03-29 DIAGNOSIS — R451 Restlessness and agitation: Secondary | ICD-10-CM

## 2020-03-29 DIAGNOSIS — I10 Essential (primary) hypertension: Secondary | ICD-10-CM | POA: Diagnosis not present

## 2020-03-29 DIAGNOSIS — F0280 Dementia in other diseases classified elsewhere without behavioral disturbance: Secondary | ICD-10-CM

## 2020-03-29 MED ORDER — ALBUTEROL SULFATE HFA 108 (90 BASE) MCG/ACT IN AERS: 2.0000 | INHALATION_SPRAY | RESPIRATORY_TRACT | 2 refills | Status: AC | PRN

## 2020-03-29 MED ORDER — QUETIAPINE FUMARATE 25 MG PO TABS: 25.0000 mg | ORAL_TABLET | Freq: Every day | ORAL | 0 refills | Status: DC

## 2020-03-29 NOTE — Addendum Note (Signed)
Addended by: Olin Hauser on: 03/29/2020 02:05 PM   Modules accepted: Orders

## 2020-03-29 NOTE — Progress Notes (Signed)
[facility] Consult Note Telephone: [phone]  Fax: [phone]  PATIENT NAME: Travis Mccoy DOB: [birth year] MRN: [id number]  PRIMARY CARE PROVIDER:   Olin Hauser, DO  REFERRING PROVIDER:  Olin Hauser, DO [address] [place],  [place] [address]  RESPONSIBLE PARTY:   Sheryle Hail [phone]    RECOMMENDATIONS and PLAN:  1.  Advanced care planning.  Patient has conflicting info in Epic.  Hospital notes indicate DNR and full code.  Discussed with family and they would like to discuss together.  Left MOST form to them to go over together.  2.  Alzheimer's dementia.  FAST 6d.  Patient had fall with left femur fracture on 03/21/20.  Was in hospital 3/24-3/27/21 in which he had surgical repair.  He is receiving home health PT/OT through [facility].  Currently he is bed bound and wears pull ups for occasional incontinence. Did have concerns that he had not voided since 4:30 am and at 12:30 pm he had not voided.  Reassured when he voided prior to provider leaving at 1:00pm.  He still at times can make known when he needs to go to the bathroom.  He is able to feed himself.  His appetite has decreased since the fall but has started improving now that he is back home.  Family does state that he at times will see things that are not there.  Patient has just been taken off Namenda and started on seroquel 25mg  QHS.  Continue PT/OT through home health as ordered.  Have talked with Dr. Parks Ranger about adding home health RN in case needed for urinary retention.  Monitor for effectiveness of seroquel.  Discussed with family side effects of daytime drowsiness and possible increased confusion.  Encouraged to call if noticing side effects for any possible med changes.  3.  Pain.  Patient does have pain with movement even though he denies pain.  Family have been giving Tylenol scheduled every 4 hours.  Discussed not to give more than 3000mg  in a  24 hour period. Discussed that as he works more with therapy he may have increased pain as he starts working his muscles more.  Recommended that he could also use topical agents such as voltaren gel or other muscle rubs to help with the pain.  4.  Decreased O2 sats.  Patient had episode yesterday in which O2 was 73% reported by PT.  PCP was called and they were instructed to call EMS.  He was satting in the 90s when they got there and did not have indication to take him to ER.  Today during visit he was originally satting in the 90s with clear lung sounds.  He started wheezing later in the visit and his O2 went down to 76%.  Wheezing could be heard in bilateral upper lobes. Was unable to get an O2 reading after this but the wheezing cleared. He was not in any respiratory distress and continued to have normal respiratory effort.  Patient has no history of COPD or asthma.  Discussed this with Dr. Parks Ranger and he ordered albuterol inhaler to start with as insurance may not pay for a nebulizer with no history of respiratory issues.  May have to change to nebulized if he is unable to get enough of the medicine to be effective.  Family is going to get a pulse oximeter to monitor his oxygen levels more frequently.  Have instructed that if he continues to have episodes of wheezing  and decreasing oxygen levels to call EMS and have him evaluated in ER for possible pulmonary embolism.  Palliative will continue to monitor for symptom management/decline and make recommendations as needed.  Next visit is in 2 weeks  I spent 150 minutes providing this consultation,  from 10:30 to 1:00 including time with family/patient, chart review, provider coordination, documentation. More than 50% of the time in this consultation was spent coordinating communication.   HISTORY OF PRESENT ILLNESS:  Travis Mccoy is a 86 y.o. year old male with multiple medical problems including alzheimer's dementia, HTN, BPH, GERD, left femur  fracture. Palliative Care was asked to help address goals of care.   CODE STATUS: see above  PPS: 30% HOSPICE ELIGIBILITY/DIAGNOSIS: TBD  PHYSICAL EXAM:  BP 124/58  HR 69  O2 91% on RA General: NAD, frail appearing, thin Cardiovascular: regular rate and rhythm Pulmonary: lung sounds clear; normal respiratory effort Abdomen: soft, nontender, + bowel sounds GU: no suprapubic tenderness Extremities: no edema, no joint deformities Skin: no rashes Neurological: Weakness; A&O to person and place  Later in visit patient started wheezing.  Heard wheezing in bilateral upper lobes and O2 went down to 76% on RA (no O2 in the home).  Was unable to get an O2 reading after this but patient's wheezing had cleared and he was in no respiratory distress.    PAST MEDICAL HISTORY:  Past Medical History:  Diagnosis Date  . GERD (gastroesophageal reflux disease)   . Hyperlipidemia   . Hypertension     SOCIAL HX:  Social History   Tobacco Use  . Smoking status: Former Smoker    Types: Cigarettes    Quit date: 08/30/1959    Years since quitting: 60.6  . Smokeless tobacco: Former Network engineer Use Topics  . Alcohol use: Not Currently    ALLERGIES:  Allergies  Allergen Reactions  . Lisinopril Cough     PERTINENT MEDICATIONS:  Outpatient Encounter Medications as of 03/29/2020  Medication Sig  . albuterol (VENTOLIN HFA) 108 (90 Base) MCG/ACT inhaler Inhale 2 puffs into the lungs every 4 (four) hours as needed for wheezing or shortness of breath (cough).  Marland Kitchen aspirin 81 MG tablet Take 1 tablet (81 mg total) by mouth daily.  . divalproex (DEPAKOTE ER) 250 MG 24 hr tablet Take 1 tablet every afternoon. May give additional dose as needed for agitation  . enoxaparin (LOVENOX) 40 MG/0.4ML injection Inject 0.4 mLs (40 mg total) into the skin daily for 14 days.  Marland Kitchen escitalopram (LEXAPRO) 20 MG tablet Take 1 tablet (20 mg total) by mouth daily.  . feeding supplement, ENSURE ENLIVE, (ENSURE ENLIVE) LIQD  Take 237 mLs by mouth 2 (two) times daily between meals.  . Melatonin 10 MG TABS Take 10 mg by mouth at bedtime.  . Multiple Vitamin (MULTIVITAMIN WITH MINERALS) TABS tablet Take 1 tablet by mouth daily.  . Multiple Vitamins-Minerals (PRESERVISION AREDS 2+MULTI VIT PO) Take 2 tablets by mouth daily.   Marland Kitchen omeprazole (PRILOSEC) 20 MG capsule TAKE 1 CAPSULE BY MOUTH  DAILY  . pravastatin (PRAVACHOL) 20 MG tablet TAKE 1 TABLET BY MOUTH  EVERY OTHER EVENING  . QUEtiapine (SEROQUEL) 25 MG tablet Take 1 tablet (25 mg total) by mouth at bedtime.  . tamsulosin (FLOMAX) 0.4 MG CAPS capsule Take 1 capsule (0.4 mg total) by mouth daily.   No facility-administered encounter medications on file as of 03/29/2020.      Blanton Kardell Jenetta Downer, NP

## 2020-03-30 DIAGNOSIS — Z7982 Long term (current) use of aspirin: Secondary | ICD-10-CM | POA: Diagnosis not present

## 2020-03-30 DIAGNOSIS — I10 Essential (primary) hypertension: Secondary | ICD-10-CM | POA: Diagnosis not present

## 2020-03-30 DIAGNOSIS — I4892 Unspecified atrial flutter: Secondary | ICD-10-CM | POA: Diagnosis not present

## 2020-03-30 DIAGNOSIS — K219 Gastro-esophageal reflux disease without esophagitis: Secondary | ICD-10-CM | POA: Diagnosis not present

## 2020-03-30 DIAGNOSIS — S72142D Displaced intertrochanteric fracture of left femur, subsequent encounter for closed fracture with routine healing: Secondary | ICD-10-CM | POA: Diagnosis not present

## 2020-03-30 DIAGNOSIS — Z9181 History of falling: Secondary | ICD-10-CM | POA: Diagnosis not present

## 2020-03-30 DIAGNOSIS — E785 Hyperlipidemia, unspecified: Secondary | ICD-10-CM | POA: Diagnosis not present

## 2020-03-30 DIAGNOSIS — I083 Combined rheumatic disorders of mitral, aortic and tricuspid valves: Secondary | ICD-10-CM | POA: Diagnosis not present

## 2020-03-30 DIAGNOSIS — Z7901 Long term (current) use of anticoagulants: Secondary | ICD-10-CM | POA: Diagnosis not present

## 2020-03-30 DIAGNOSIS — G309 Alzheimer's disease, unspecified: Secondary | ICD-10-CM | POA: Diagnosis not present

## 2020-03-30 DIAGNOSIS — Z87891 Personal history of nicotine dependence: Secondary | ICD-10-CM | POA: Diagnosis not present

## 2020-03-30 MED ORDER — DIVALPROEX SODIUM 125 MG PO CSDR: DELAYED_RELEASE_CAPSULE | ORAL | 2 refills | Status: AC

## 2020-03-30 MED ORDER — DIVALPROEX SODIUM 125 MG PO CSDR: DELAYED_RELEASE_CAPSULE | ORAL | 2 refills | Status: DC

## 2020-03-30 NOTE — Addendum Note (Signed)
Addended by: Olin Hauser on: 03/30/2020 12:47 PM   Modules accepted: Orders

## 2020-04-02 NOTE — Addendum Note (Signed)
Addended by: Olin Hauser on: 04/02/2020 09:18 AM   Modules accepted: Orders

## 2020-04-03 ENCOUNTER — Other Ambulatory Visit: Payer: Self-pay | Admitting: Family Medicine

## 2020-04-03 DIAGNOSIS — F01518 Vascular dementia, unspecified severity, with other behavioral disturbance: Secondary | ICD-10-CM

## 2020-04-03 DIAGNOSIS — F0151 Vascular dementia with behavioral disturbance: Secondary | ICD-10-CM

## 2020-04-06 ENCOUNTER — Telehealth: Payer: Self-pay | Admitting: Family Medicine

## 2020-04-06 NOTE — Telephone Encounter (Signed)
Called back [facility] (cell [phone])  Last week we inc Seroquel 25mg  increased up to TID.  He still has agitation and restlessness waking up at night and trying to get out of bed etc.  She is asking about adjusting dose.  I recommended keep Seroquel 25mg  AM / afternoon and increase PM dose to 50mg  in PM.  Also they may use Hospice protocol standing orders for Ativan as needed.  [name], [facility] 04/06/2020, 12:33 PM

## 2020-04-06 NOTE — Telephone Encounter (Signed)
Davanea the RN from [facility] was calling to report about patient's agitation. Seroquel was increased in past which works great for couple of days but now not improving his agitation so wanted to know if Dr.K want to increase the dose or change the medication ?

## 2020-04-10 ENCOUNTER — Telehealth: Payer: Self-pay | Admitting: Family Medicine

## 2020-04-10 NOTE — Telephone Encounter (Signed)
[facility] calling wants to let Dr know that chging bedtime medicine worked all good. 2. Needs Verbal to [phone] Pt had hip surgery and now needs staples removed. Pt is of course bedbound in Hospice and no way to get anywhere to remove the staples. She does not mind doing it and would like an order to do so at [phone]

## 2020-04-11 NOTE — Telephone Encounter (Signed)
Called [facility] back.  1. Glad that the Seroquel dose change has helped night-time. 2. I gave her verbal permission to remove staples as indicated, now >2 weeks, ready to be removed.  [name], [facility] 04/11/2020, 10:03 AM

## 2020-04-18 NOTE — Telephone Encounter (Signed)
I attempted to call [phone] got the hospice nurse voicemail. I left a general message for them to call back.  About 1-2 weeks ago on 04/06/20 we increased Seroquel from 25mg  3 times daily, to up to 25mg  AM / 25mg  afternoon and 50mg  PM  It was working well.  Now call today suggests his agitation is worsening.  I did ask if hospice could start Ativan protocol for agitation on their standing orders.  Need more information from hospice. Need to know what time of day his symptoms are worse. Is it only night-time agitation still? If he has no daytime symptoms, then I would keep the 25mg  in morning and 25mg  in afternoon, and we can consider dose increase seroquel up to 75 to 100mg  at night. Would be start with x 3 of the 25mg  tabs at night, and if not successful after another 1 week they could go up to 100mg  at night.  Also wanted to check on their hospice standing orders to see if there is anything else they can offer for agitation.  [name], [facility] 04/18/2020, 1:32 PM

## 2020-04-18 NOTE — Telephone Encounter (Unsigned)
Copied from [place] [phone]. Topic: General - Inquiry >> Apr 18, 2020  9:55 AM Richardo Priest, NT wrote: Reason for CRM: Hinton Dyer with hospice called in to request that patient's seroquel be increased, as his agitation is getting worse. Please advise.

## 2020-04-19 MED ORDER — QUETIAPINE FUMARATE 25 MG PO TABS: ORAL_TABLET | ORAL | 1 refills | Status: AC

## 2020-04-19 NOTE — Telephone Encounter (Signed)
Left message to call back  

## 2020-04-19 NOTE — Telephone Encounter (Signed)
Alright. I would agree and increase Seroquel dose in PM.  Please notify hospice.  New instructions  Take 1 tab (25mg ) in morning, take 1 tab (25mg ) in afternoon, and take 3 to 4 pills (dose 75 to 100mg ) in evening for agitation as needed.  New rx sent to [employer] 04/19/2020, 4:04 PM

## 2020-04-19 NOTE — Telephone Encounter (Signed)
Spoke to [place] [phone] the family does not want give him Lorezapam which is on the standing order but fell comfortable with increasing the dose of Seroquel, as per Davanea his agitation is worst at night which also effecting his mornings. Her phone was not working so she was unable to receive message from past two days.

## 2020-04-19 NOTE — Telephone Encounter (Signed)
Informed Travis Mccoy as per Dr.K.

## 2020-05-29 DEATH — deceased

## 2020-05-30 ENCOUNTER — Telehealth: Payer: Self-pay

## 2020-05-30 NOTE — Telephone Encounter (Signed)
Called funeral home back, spoke with [place]. I understand the situation and agree that the left hip fracture was not directly related to his cause of death. It was included as a secondary issue, but I am comfortable removing it from that list as it is not appropriate to pursue medical examiner case at this time.   Funeral home will bring copy of old death certificate and new one here tomorrow for me to complete, on 6/3 AM. Will return to them once it is updated, removing left hip fracture from secondary dx list.  [name], DO [facility] 05/30/2020, 4:44 PM

## 2020-05-30 NOTE — Telephone Encounter (Signed)
Copied from [place] [phone]. Topic: General - Other >> May 30, 2020  3:36 PM Antonieta Iba C wrote: Reason for CRM: Velva Harman with funeral home called in for urgent assistance. Velva Harman stated that they received the death certificate back from provider stating that pt had hip fracture under the "other significances area" of form. Funeral home would like to know if provider would sign form without having fracture listed? Velva Harman states having it listed would cause pt to have to have a medical exam before cremation.   Velva Harman states due to pt's service date they would like to know if this could be updated as soon as possible.   CB: [phone]

## 2022-02-13 IMAGING — CR DG HIP (WITH OR WITHOUT PELVIS) 2-3V*L*
1 series · 3 of 3 positions shown · non-contrast
Comparison: None.

CLINICAL DATA: Pt arrives via [facility] from home after attempting to
get out of the car and falling back into the car. PT has shortening
of left leg and c/o bottom pain on left side. Pt unable to bear
weight on the left leg. Left leg is foreshortened; Pre op

EXAM:
DG HIP (WITH OR WITHOUT PELVIS) 2-3V LEFT

[Series 1: dg hip unilat w or w/o pelvis 2-3 views  · non-contrast · 0.14mm/px · 3 of 3 slices shown]
[im 1/3]
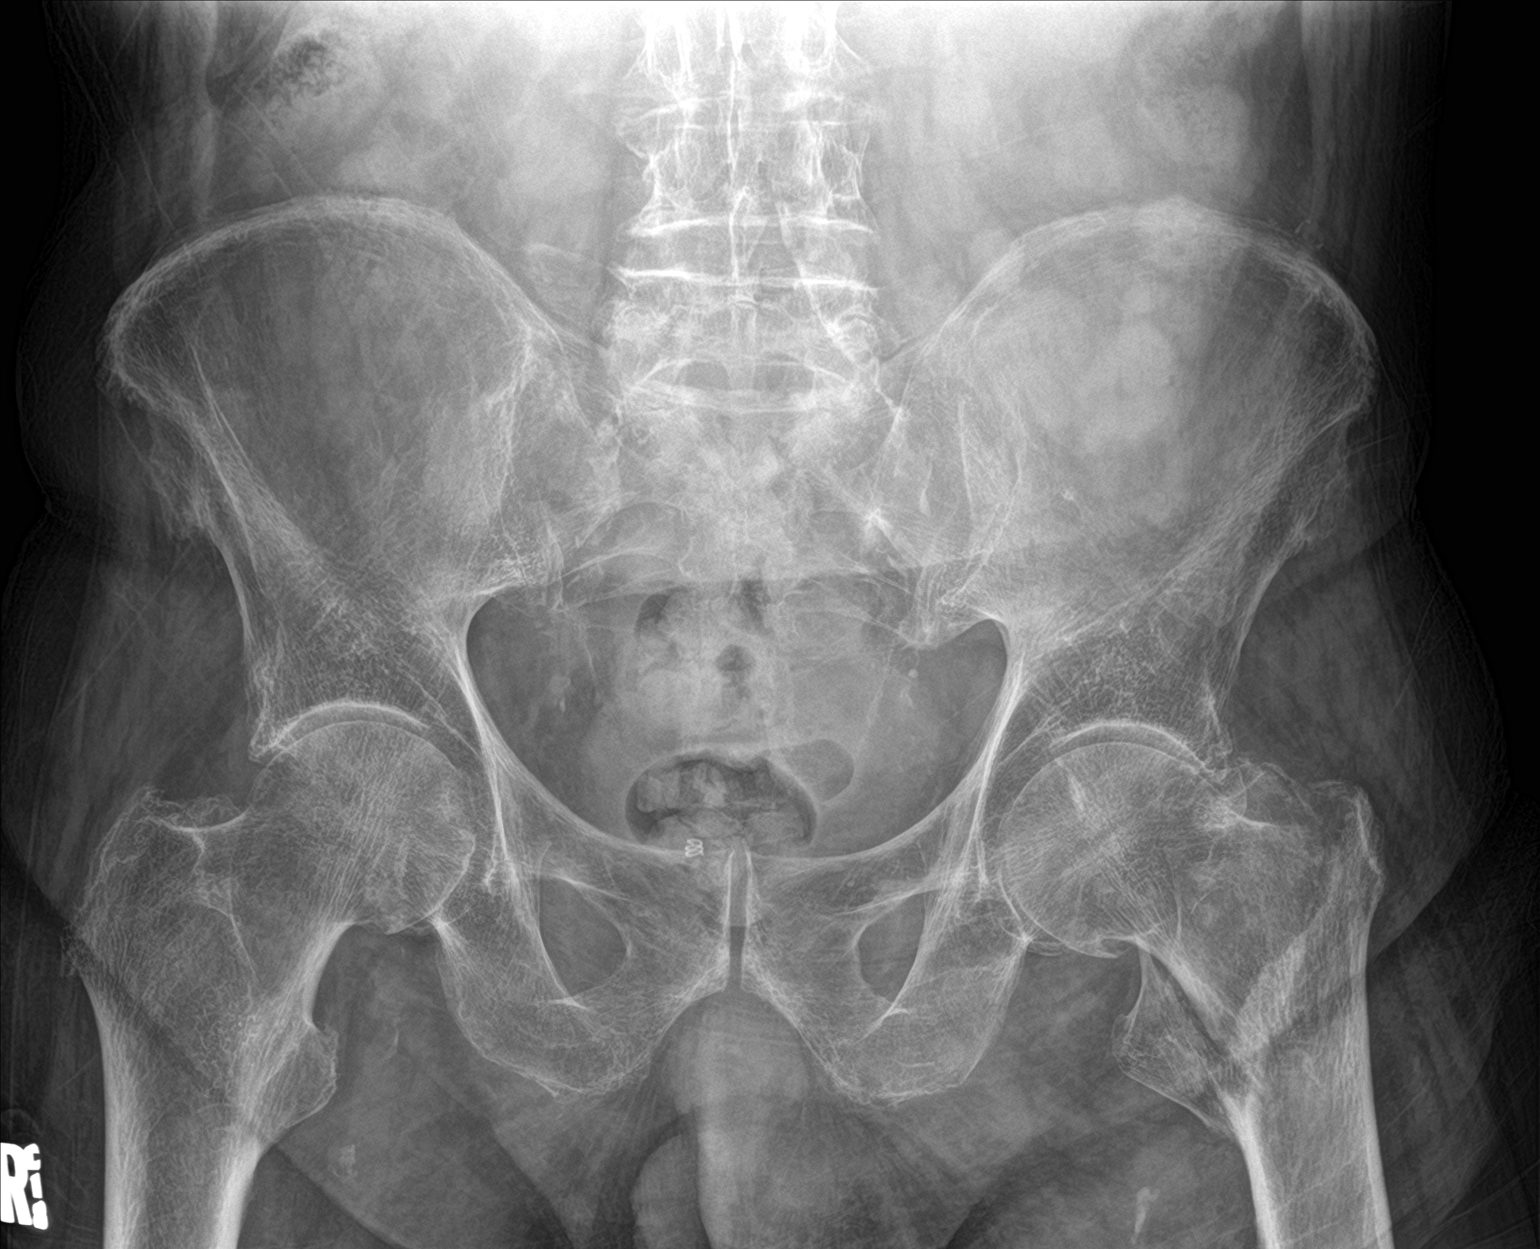
[im 2/3]
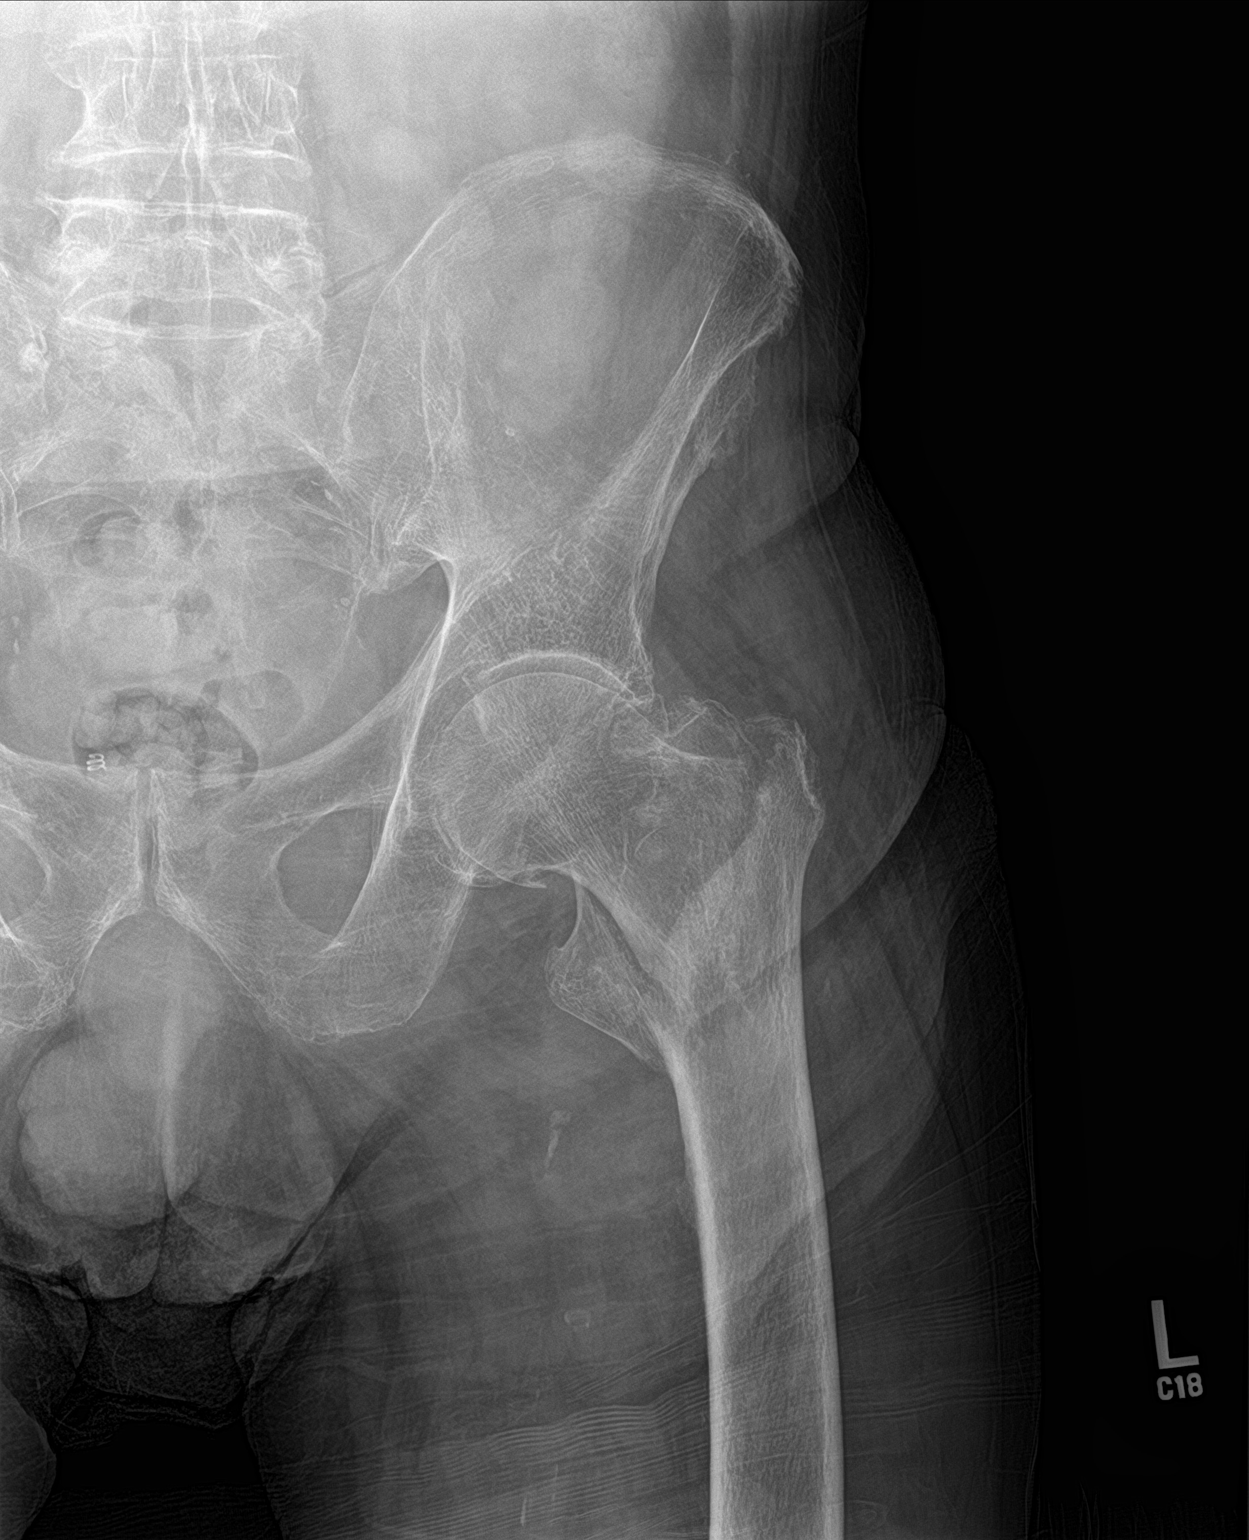
[im 3/3]
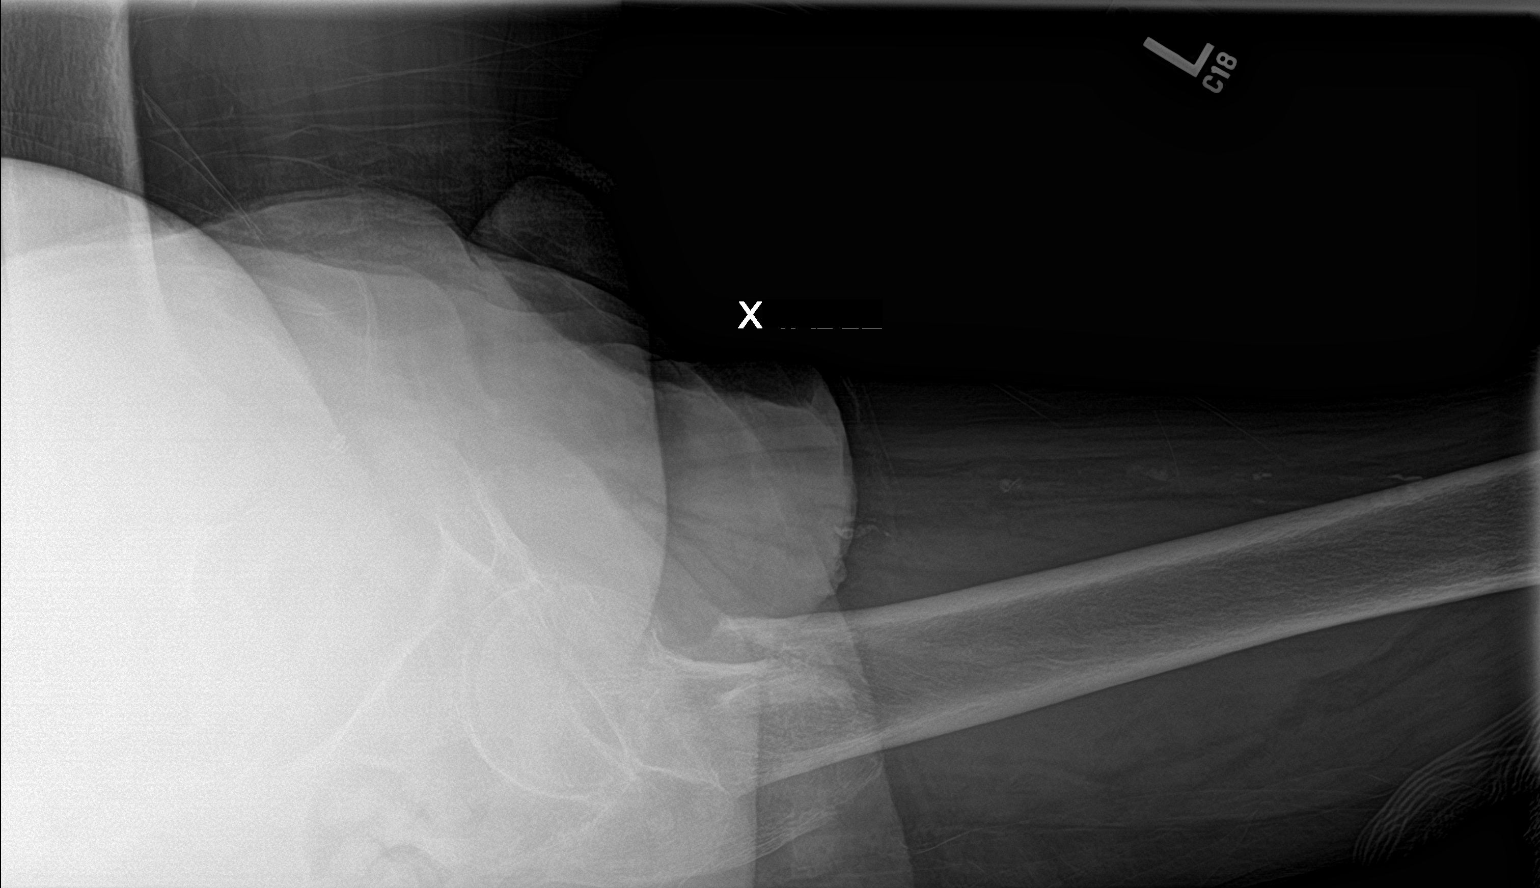

[3 of 3 positions shown; findings below may reference images not displayed]

FINDINGS: Intertrochanteric fracture of the proximal left femur. Primary
fracture components are well aligned with minimal displacement and
no significant angulation. There are separate fracture components of
the lesser and greater trochanters.

No other fractures.  No bone lesions.

Hip joints, SI joints and symphysis pubis are normally aligned.

Skeletal structures are diffusely demineralized.
IMPRESSION: 1. Comminuted intertrochanteric fracture of the proximal left femur
with no significant displacement or angulation.
